# Patient Record
Sex: Female | Born: 1945 | Race: White | Hispanic: No | Marital: Single | Smoking: Never smoker
Health system: Southern US, Community
[De-identification: ages and names within clinical notes are randomized; demographics above are authoritative.]

## PROBLEM LIST (undated history)

## (undated) DIAGNOSIS — R413 Other amnesia: Secondary | ICD-10-CM

## (undated) DIAGNOSIS — F32A Depression, unspecified: Secondary | ICD-10-CM

## (undated) DIAGNOSIS — F329 Major depressive disorder, single episode, unspecified: Secondary | ICD-10-CM

## (undated) DIAGNOSIS — E538 Deficiency of other specified B group vitamins: Secondary | ICD-10-CM

## (undated) DIAGNOSIS — G309 Alzheimer's disease, unspecified: Secondary | ICD-10-CM

## (undated) DIAGNOSIS — T4145XA Adverse effect of unspecified anesthetic, initial encounter: Secondary | ICD-10-CM

## (undated) DIAGNOSIS — I5041 Acute combined systolic (congestive) and diastolic (congestive) heart failure: Secondary | ICD-10-CM

## (undated) DIAGNOSIS — F0280 Dementia in other diseases classified elsewhere without behavioral disturbance: Secondary | ICD-10-CM

## (undated) DIAGNOSIS — T8859XA Other complications of anesthesia, initial encounter: Secondary | ICD-10-CM

## (undated) DIAGNOSIS — I10 Essential (primary) hypertension: Secondary | ICD-10-CM

## (undated) DIAGNOSIS — I219 Acute myocardial infarction, unspecified: Secondary | ICD-10-CM

## (undated) DIAGNOSIS — I513 Intracardiac thrombosis, not elsewhere classified: Secondary | ICD-10-CM

## (undated) HISTORY — DX: Other amnesia: R41.3

## (undated) HISTORY — DX: Dementia in other diseases classified elsewhere without behavioral disturbance: F02.80

## (undated) HISTORY — PX: APPENDECTOMY: SHX54

## (undated) HISTORY — DX: Deficiency of other specified B group vitamins: E53.8

## (undated) HISTORY — DX: Alzheimer's disease, unspecified: G30.9

---
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
## 1997-12-04 ENCOUNTER — Ambulatory Visit (HOSPITAL_COMMUNITY): Admission: RE | Admit: 1997-12-04 | Discharge: 1997-12-04 | Payer: Self-pay | Admitting: Obstetrics & Gynecology

## 1997-12-21 ENCOUNTER — Other Ambulatory Visit: Admission: RE | Admit: 1997-12-21 | Discharge: 1997-12-21 | Payer: Self-pay | Admitting: Obstetrics & Gynecology

## 1999-11-28 ENCOUNTER — Ambulatory Visit (HOSPITAL_COMMUNITY): Admission: RE | Admit: 1999-11-28 | Discharge: 1999-11-28 | Payer: Self-pay | Admitting: Obstetrics & Gynecology

## 1999-11-28 ENCOUNTER — Encounter: Payer: Self-pay | Admitting: Obstetrics & Gynecology

## 1999-12-26 ENCOUNTER — Other Ambulatory Visit: Admission: RE | Admit: 1999-12-26 | Discharge: 1999-12-26 | Payer: Self-pay | Admitting: Family Medicine

## 2005-06-20 ENCOUNTER — Ambulatory Visit (HOSPITAL_COMMUNITY): Admission: RE | Admit: 2005-06-20 | Discharge: 2005-06-20 | Payer: Self-pay | Admitting: Family Medicine

## 2005-06-23 ENCOUNTER — Ambulatory Visit (HOSPITAL_COMMUNITY): Admission: RE | Admit: 2005-06-23 | Discharge: 2005-06-23 | Payer: Self-pay | Admitting: Interventional Radiology

## 2005-07-10 ENCOUNTER — Encounter: Payer: Self-pay | Admitting: Interventional Radiology

## 2010-06-19 ENCOUNTER — Encounter: Payer: Self-pay | Admitting: Obstetrics & Gynecology

## 2013-06-11 ENCOUNTER — Emergency Department (HOSPITAL_BASED_OUTPATIENT_CLINIC_OR_DEPARTMENT_OTHER)
Admission: EM | Admit: 2013-06-11 | Discharge: 2013-06-11 | Disposition: A | Discharge status: Home / Self Care | Payer: Medicare Other | Attending: Emergency Medicine | Admitting: Emergency Medicine

## 2013-06-11 ENCOUNTER — Emergency Department (HOSPITAL_BASED_OUTPATIENT_CLINIC_OR_DEPARTMENT_OTHER): Payer: Medicare Other

## 2013-06-11 ENCOUNTER — Encounter (HOSPITAL_BASED_OUTPATIENT_CLINIC_OR_DEPARTMENT_OTHER): Payer: Self-pay | Admitting: Emergency Medicine

## 2013-06-11 DIAGNOSIS — I10 Essential (primary) hypertension: Secondary | ICD-10-CM | POA: Insufficient documentation

## 2013-06-11 DIAGNOSIS — Y929 Unspecified place or not applicable: Secondary | ICD-10-CM | POA: Insufficient documentation

## 2013-06-11 DIAGNOSIS — Y939 Activity, unspecified: Secondary | ICD-10-CM | POA: Insufficient documentation

## 2013-06-11 DIAGNOSIS — S42302A Unspecified fracture of shaft of humerus, left arm, initial encounter for closed fracture: Secondary | ICD-10-CM

## 2013-06-11 DIAGNOSIS — S42213A Unspecified displaced fracture of surgical neck of unspecified humerus, initial encounter for closed fracture: Secondary | ICD-10-CM | POA: Insufficient documentation

## 2013-06-11 DIAGNOSIS — Z79899 Other long term (current) drug therapy: Secondary | ICD-10-CM | POA: Insufficient documentation

## 2013-06-11 DIAGNOSIS — R296 Repeated falls: Secondary | ICD-10-CM | POA: Insufficient documentation

## 2013-06-11 HISTORY — DX: Essential (primary) hypertension: I10

## 2013-06-11 MED ORDER — HYDROCODONE-ACETAMINOPHEN 5-325 MG PO TABS
2.0000 | ORAL_TABLET | Freq: Once | ORAL | Status: AC
  Administered 2013-06-11: 2 via ORAL
  Filled 2013-06-11: qty 2

## 2013-06-11 MED ORDER — HYDROCODONE-ACETAMINOPHEN 5-325 MG PO TABS
1.0000 | ORAL_TABLET | Freq: Four times a day (QID) | ORAL | Status: DC | PRN
Start: 2013-06-11 — End: 2013-11-27

## 2013-06-11 NOTE — ED Notes (Signed)
Larey Seat this morning on cement stairs and tried to catch herself with her left arm. States the pain starts in her right shoulder and radiates down. Good pulse and color

## 2013-06-11 NOTE — Discharge Instructions (Signed)
Humerus Fracture, Treated with Immobilization °The humerus is the large bone in your upper arm. You have a broken (fractured) humerus. These fractures are easily diagnosed with X-rays. °TREATMENT  °Simple fractures which will heal without disability are treated with simple immobilization. Immobilization means you will wear a cast, splint, or sling. You have a fracture which will do well with immobilization. The fracture will heal well simply by being held in a good position until it is stable enough to begin range of motion exercises. Do not take part in activities which would further injure your arm.  °HOME CARE INSTRUCTIONS  °· Put ice on the injured area. °· Put ice in a plastic bag. °· Place a towel between your skin and the bag. °· Leave the ice on for 15-20 minutes, 03-04 times a day. °· If you have a cast: °· Do not scratch the skin under the cast using sharp or pointed objects. °· Check the skin around the cast every day. You may put lotion on any red or sore areas. °· Keep your cast dry and clean. °· If you have a splint: °· Wear the splint as directed. °· Keep your splint dry and clean. °· You may loosen the elastic around the splint if your fingers become numb, tingle, or turn cold or blue. °· If you have a sling: °· Wear the sling as directed. °· Do not put pressure on any part of your cast or splint until it is fully hardened. °· Your cast or splint can be protected during bathing with a plastic bag. Do not lower the cast or splint into water. °· Only take over-the-counter or prescription medicines for pain, discomfort, or fever as directed by your caregiver. °· Do range of motion exercises as instructed by your caregiver. °· Follow up as directed by your caregiver. This is very important in order to avoid permanent injury or disability and chronic pain. °SEEK IMMEDIATE MEDICAL CARE IF:  °· Your skin or nails in the injured arm turn blue or gray. °· Your arm feels cold or numb. °· You develop severe  pain in the injured arm. °· You are having problems with the medicines you were given. °MAKE SURE YOU:  °· Understand these instructions. °· Will watch your condition. °· Will get help right away if you are not doing well or get worse. °Document Released: 08/21/2000 Document Revised: 08/07/2011 Document Reviewed: 06/29/2010 °[employer]® Patient Information ©2014 [employer]. ° °

## 2013-06-11 NOTE — ED Provider Notes (Signed)
CSN: [id number]     Arrival date & time 06/11/13  1322 History   First MD Initiated Contact with Patient 06/11/13 1359     Chief Complaint  Patient presents with  . Arm Injury   (Consider location/radiation/quality/duration/timing/severity/associated sxs/prior Treatment) HPI Comments: Slipped on ice, fell on L arm. No head injury, no LOC.  Patient is a 68 y.o. female presenting with fall. The history is provided by the patient.  Fall This is a new problem. The current episode started 1 to 2 hours ago. Episode frequency: once. The problem has not changed since onset.Pertinent negatives include no chest pain, no abdominal pain and no shortness of breath. Nothing aggravates the symptoms. Nothing relieves the symptoms.    Past Medical History  Diagnosis Date  . Hypertension    Past Surgical History  Procedure Laterality Date  . Back surgery     No family history on file. History  Substance Use Topics  . Smoking status: Never Smoker   . Smokeless tobacco: Not on file  . Alcohol Use: No   OB History   Grav Para Term Preterm Abortions TAB SAB Ect Mult Living                 Review of Systems  Constitutional: Negative for fever and chills.  Respiratory: Negative for shortness of breath.   Cardiovascular: Negative for chest pain.  Gastrointestinal: Negative for abdominal pain.  All other systems reviewed and are negative.    Allergies  Erythromycin  Home Medications   Current Outpatient Rx  Name  Route  Sig  Dispense  Refill  . donepezil (ARICEPT) 10 MG tablet   Oral   Take 10 mg by mouth at bedtime.         . sertraline (ZOLOFT) 100 MG tablet   Oral   Take 100 mg by mouth daily.         . zaleplon (SONATA) 10 MG capsule   Oral   Take 10 mg by mouth at bedtime as needed for sleep.          BP 146/99  Pulse 68  Temp(Src) 98.1 F (36.7 C) (Oral)  Resp 18  Ht 5\' 3"  (1.6 m)  Wt 113 lb (51.256 kg)  BMI 20.02 kg/m2  SpO2 100% Physical Exam  Nursing  note and vitals reviewed. Constitutional: She is oriented to person, place, and time. She appears well-developed and well-nourished. No distress.  HENT:  Head: Normocephalic and atraumatic.  Eyes: EOM are normal. Pupils are equal, round, and reactive to light.  Neck: Normal range of motion. Neck supple.  Cardiovascular: Normal rate and regular rhythm.  Exam reveals no friction rub.   No murmur heard. Pulmonary/Chest: Effort normal and breath sounds normal. No respiratory distress. She has no wheezes. She has no rales.  Abdominal: Soft. She exhibits no distension. There is no tenderness. There is no rebound.  Musculoskeletal: She exhibits no edema.       Left shoulder: She exhibits decreased range of motion, tenderness, bony tenderness and swelling (mild, lateral deltoid).  Neurological: She is alert and oriented to person, place, and time.  Skin: She is not diaphoretic.    ED Course  Procedures (including critical care time) Labs Review Labs Reviewed - No data to display Imaging Review Dg Forearm Left  06/11/2013   CLINICAL DATA:  Fall, left arm pain  EXAM: LEFT FOREARM - 2 VIEW  COMPARISON:  Concurrently obtained radiographs of the humerus and hand  FINDINGS: There is  no evidence of fracture or other focal bone lesions. Soft tissues are unremarkable.  IMPRESSION: Negative.   Electronically Signed   By: Malachy Moan M.D.   On: 06/11/2013 15:04   Dg Humerus Left  06/11/2013   CLINICAL DATA:  Fall, left arm pain  EXAM: LEFT HUMERUS - 2+ VIEW  COMPARISON:  Concurrently obtained radiographs of a forearm and hand  FINDINGS: Comminuted and slightly impacted fracture through the surgical neck of the humerus extending into the greater trochanter. The humeral head appears located with respect to the glenoid. Normal bony mineralization. No lytic or blastic osseous lesion. Visualized thorax unremarkable.  IMPRESSION: Comminuted fracture through the surgical neck of the humerus extending into the  greater trochanter.   Electronically Signed   By: Malachy Moan M.D.   On: 06/11/2013 15:04   Dg Hand Complete Left  06/11/2013   CLINICAL DATA:  Fall, left arm pain  EXAM: LEFT HAND - COMPLETE 3+ VIEW  COMPARISON:  Concurrently obtained radiographs of the humerus and forearm  FINDINGS: There is no evidence of fracture or dislocation. The bones are mildly osteopenic. 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 Irregularity to the proximal aspect of the distal phalanx of the small finger appears well corticated and is may reflect the sequelae of a remote healed fracture. Osteoarthritis at the thumb carpometacarpal joint. .  IMPRESSION: Favor residual posttraumatic deformity from a remote healed fracture through the distal phalanx of the little finger. Given the background osteopenia, an acute fracture is a less likely possibility. Recommend clinical correlation for point tenderness at this site.  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   Electronically Signed   By: Malachy Moan M.D.   On: 06/11/2013 15:07    EKG Interpretation   None       MDM   1. Fracture of left humerus    47F fell onto stairs today. Landed on outstretched L elbow/forearm. No head injury, did not continue to fall down the stairs. Has L superior humerus pain, L forearm pain. Diffuse pain throughout L arm, patient unable to delineate source of pain. Will xray. NVI distally.  Xrays show L humerus fracture at surgical neck. Placed in sling. Given pain meds, Ortho f/u.    Dagmar Hait, MD 06/11/13 [phone]

## 2013-11-27 ENCOUNTER — Encounter (HOSPITAL_COMMUNITY)
Admission: EM | Disposition: A | Discharge status: Home / Self Care | Payer: Self-pay | Source: Home / Self Care | Attending: Emergency Medicine

## 2013-11-27 ENCOUNTER — Encounter (HOSPITAL_COMMUNITY)
Admission: EM | Disposition: A | Discharge status: Home / Self Care | Payer: Self-pay | Source: Home / Self Care | Attending: Orthopedic Surgery

## 2013-11-27 ENCOUNTER — Emergency Department (HOSPITAL_COMMUNITY): Payer: Medicare Other

## 2013-11-27 ENCOUNTER — Inpatient Hospital Stay: Admit: 2013-11-27 | Payer: Self-pay | Admitting: Orthopedic Surgery

## 2013-11-27 ENCOUNTER — Emergency Department (HOSPITAL_COMMUNITY)
Admission: EM | Admit: 2013-11-27 | Discharge: 2013-11-27 | Disposition: A | Discharge status: Home / Self Care | Payer: Medicare Other | Source: Home / Self Care | Attending: Emergency Medicine | Admitting: Emergency Medicine

## 2013-11-27 ENCOUNTER — Inpatient Hospital Stay (HOSPITAL_COMMUNITY): Payer: Medicare Other | Admitting: Certified Registered Nurse Anesthetist

## 2013-11-27 ENCOUNTER — Observation Stay (HOSPITAL_COMMUNITY)
Admission: EM | Admit: 2013-11-27 | Discharge: 2013-11-28 | Disposition: A | Discharge status: Home / Self Care | Payer: Medicare Other | Attending: Emergency Medicine | Admitting: Emergency Medicine

## 2013-11-27 ENCOUNTER — Encounter (HOSPITAL_COMMUNITY): Payer: Medicare Other | Admitting: Certified Registered Nurse Anesthetist

## 2013-11-27 ENCOUNTER — Encounter (HOSPITAL_COMMUNITY): Payer: Self-pay | Admitting: Emergency Medicine

## 2013-11-27 ENCOUNTER — Encounter (HOSPITAL_COMMUNITY): Payer: Self-pay | Admitting: *Deleted

## 2013-11-27 DIAGNOSIS — Z881 Allergy status to other antibiotic agents status: Secondary | ICD-10-CM

## 2013-11-27 DIAGNOSIS — Y92838 Other recreation area as the place of occurrence of the external cause: Secondary | ICD-10-CM | POA: Diagnosis not present

## 2013-11-27 DIAGNOSIS — S0083XA Contusion of other part of head, initial encounter: Secondary | ICD-10-CM | POA: Insufficient documentation

## 2013-11-27 DIAGNOSIS — Z79899 Other long term (current) drug therapy: Secondary | ICD-10-CM | POA: Diagnosis not present

## 2013-11-27 DIAGNOSIS — Y9239 Other specified sports and athletic area as the place of occurrence of the external cause: Secondary | ICD-10-CM | POA: Diagnosis not present

## 2013-11-27 DIAGNOSIS — S1093XA Contusion of unspecified part of neck, initial encounter: Secondary | ICD-10-CM | POA: Insufficient documentation

## 2013-11-27 DIAGNOSIS — W010XXA Fall on same level from slipping, tripping and stumbling without subsequent striking against object, initial encounter: Secondary | ICD-10-CM | POA: Insufficient documentation

## 2013-11-27 DIAGNOSIS — F329 Major depressive disorder, single episode, unspecified: Secondary | ICD-10-CM | POA: Diagnosis not present

## 2013-11-27 DIAGNOSIS — F3289 Other specified depressive episodes: Secondary | ICD-10-CM | POA: Insufficient documentation

## 2013-11-27 DIAGNOSIS — S0003XA Contusion of scalp, initial encounter: Secondary | ICD-10-CM

## 2013-11-27 DIAGNOSIS — S52279A Monteggia's fracture of unspecified ulna, initial encounter for closed fracture: Secondary | ICD-10-CM

## 2013-11-27 DIAGNOSIS — S0033XA Contusion of nose, initial encounter: Secondary | ICD-10-CM

## 2013-11-27 DIAGNOSIS — I10 Essential (primary) hypertension: Secondary | ICD-10-CM

## 2013-11-27 DIAGNOSIS — S52101A Unspecified fracture of upper end of right radius, initial encounter for closed fracture: Secondary | ICD-10-CM

## 2013-11-27 DIAGNOSIS — S52121A Displaced fracture of head of right radius, initial encounter for closed fracture: Secondary | ICD-10-CM

## 2013-11-27 DIAGNOSIS — S52001A Unspecified fracture of upper end of right ulna, initial encounter for closed fracture: Secondary | ICD-10-CM

## 2013-11-27 DIAGNOSIS — S59909A Unspecified injury of unspecified elbow, initial encounter: Secondary | ICD-10-CM | POA: Diagnosis present

## 2013-11-27 HISTORY — DX: Major depressive disorder, single episode, unspecified: F32.9

## 2013-11-27 HISTORY — DX: Other complications of anesthesia, initial encounter: T88.59XA

## 2013-11-27 HISTORY — DX: Adverse effect of unspecified anesthetic, initial encounter: T41.45XA

## 2013-11-27 HISTORY — DX: Depression, unspecified: F32.A

## 2013-11-27 HISTORY — PX: ORIF ELBOW FRACTURE: SHX5031

## 2013-11-27 LAB — BASIC METABOLIC PANEL
Anion gap: 13 (ref 5–15)
BUN: 18 mg/dL (ref 6–23)
CHLORIDE: 104 meq/L (ref 96–112)
CO2: 24 meq/L (ref 19–32)
Calcium: 9.5 mg/dL (ref 8.4–10.5)
Creatinine, Ser: 0.79 mg/dL (ref 0.50–1.10)
GFR calc Af Amer: >90 mL/min (ref >90)
GFR calc non Af Amer: 85 mL/min — ABNORMAL LOW (ref >90)
GLUCOSE: 155 mg/dL — AB (ref 70–99)
POTASSIUM: 4 meq/L (ref 3.7–5.3)
SODIUM: 141 meq/L (ref 137–147)

## 2013-11-27 LAB — CBC
HCT: 40.9 % (ref 36.0–46.0)
HEMOGLOBIN: 13.5 g/dL (ref 12.0–15.0)
MCH: 31.1 pg (ref 26.0–34.0)
MCHC: 33 g/dL (ref 30.0–36.0)
MCV: 94.2 fL (ref 78.0–100.0)
PLATELETS: 250 10*3/uL (ref 150–400)
RBC: 4.34 MIL/uL (ref 3.87–5.11)
RDW: 14.2 % (ref 11.5–15.5)
WBC: 9.5 10*3/uL (ref 4.0–10.5)

## 2013-11-27 LAB — PROTIME-INR
INR: 1.05 (ref 0.00–1.49)
Prothrombin Time: 13.7 seconds (ref 11.6–15.2)

## 2013-11-27 SURGERY — OPEN REDUCTION INTERNAL FIXATION (ORIF) ELBOW/OLECRANON FRACTURE
Anesthesia: General | Laterality: Right

## 2013-11-27 SURGERY — OPEN REDUCTION INTERNAL FIXATION (ORIF) ELBOW/OLECRANON FRACTURE
Anesthesia: General | Site: Arm Lower | Laterality: Right

## 2013-11-27 MED ORDER — ARTIFICIAL TEARS OP OINT
TOPICAL_OINTMENT | OPHTHALMIC | Status: DC | PRN
  Administered 2013-11-27: 1 via OPHTHALMIC

## 2013-11-27 MED ORDER — SUCCINYLCHOLINE CHLORIDE 20 MG/ML IJ SOLN
INTRAMUSCULAR | Status: DC | PRN
  Administered 2013-11-27: 100 mg via INTRAVENOUS

## 2013-11-27 MED ORDER — LACTATED RINGERS IV SOLN
INTRAVENOUS | Status: DC | PRN
  Administered 2013-11-27 (×2): via INTRAVENOUS

## 2013-11-27 MED ORDER — ADULT MULTIVITAMIN W/MINERALS CH: 1.0000 | ORAL_TABLET | Freq: Every day | ORAL | Status: DC

## 2013-11-27 MED ORDER — SERTRALINE HCL 100 MG PO TABS
100.0000 mg | ORAL_TABLET | Freq: Every day | ORAL | Status: DC
  Administered 2013-11-28: 100 mg via ORAL
  Filled 2013-11-27: qty 1

## 2013-11-27 MED ORDER — MIRTAZAPINE 15 MG PO TABS
15.0000 mg | ORAL_TABLET | Freq: Every day | ORAL | Status: DC
  Administered 2013-11-27: 15 mg via ORAL
  Filled 2013-11-27 (×2): qty 1

## 2013-11-27 MED ORDER — CHLORHEXIDINE GLUCONATE 4 % EX LIQD
60.0000 mL | Freq: Once | CUTANEOUS | Status: DC
  Filled 2013-11-27: qty 60

## 2013-11-27 MED ORDER — CEFAZOLIN SODIUM-DEXTROSE 2-3 GM-% IV SOLR
2.0000 g | INTRAVENOUS | Status: AC
  Administered 2013-11-27 – 2013-11-28 (×2): 2 g via INTRAVENOUS

## 2013-11-27 MED ORDER — ONDANSETRON HCL 4 MG/2ML IJ SOLN
4.0000 mg | Freq: Once | INTRAMUSCULAR | Status: DC
  Filled 2013-11-27: qty 2

## 2013-11-27 MED ORDER — ONDANSETRON HCL 4 MG/2ML IJ SOLN: 4.0000 mg | Freq: Once | INTRAMUSCULAR | Status: DC | PRN

## 2013-11-27 MED ORDER — HYDROCODONE-ACETAMINOPHEN 7.5-325 MG PO TABS: 1.0000 | ORAL_TABLET | ORAL | Status: DC | PRN

## 2013-11-27 MED ORDER — OXYCODONE HCL 5 MG PO TABS: 5.0000 mg | ORAL_TABLET | Freq: Once | ORAL | Status: DC | PRN

## 2013-11-27 MED ORDER — OXYCODONE HCL 5 MG/5ML PO SOLN: 5.0000 mg | Freq: Once | ORAL | Status: DC | PRN

## 2013-11-27 MED ORDER — ALPRAZOLAM 0.5 MG PO TABS: 0.5000 mg | ORAL_TABLET | Freq: Four times a day (QID) | ORAL | Status: DC | PRN

## 2013-11-27 MED ORDER — 0.9 % SODIUM CHLORIDE (POUR BTL) OPTIME
TOPICAL | Status: DC | PRN
  Administered 2013-11-27: 1000 mL

## 2013-11-27 MED ORDER — DOCUSATE SODIUM 100 MG PO CAPS
100.0000 mg | ORAL_CAPSULE | Freq: Two times a day (BID) | ORAL | Status: DC
  Administered 2013-11-27: 100 mg via ORAL
  Filled 2013-11-27 (×2): qty 1

## 2013-11-27 MED ORDER — HYDROMORPHONE HCL PF 1 MG/ML IJ SOLN: 0.2500 mg | INTRAMUSCULAR | Status: DC | PRN

## 2013-11-27 MED ORDER — VITAMIN C 500 MG PO TABS
1000.0000 mg | ORAL_TABLET | Freq: Every day | ORAL | Status: DC
  Administered 2013-11-28: 1000 mg via ORAL
  Filled 2013-11-27: qty 2

## 2013-11-27 MED ORDER — PROPOFOL 10 MG/ML IV BOLUS
INTRAVENOUS | Status: DC | PRN
  Administered 2013-11-27: 150 mg via INTRAVENOUS

## 2013-11-27 MED ORDER — QUINAPRIL HCL 10 MG PO TABS
40.0000 mg | ORAL_TABLET | Freq: Every day | ORAL | Status: DC
  Administered 2013-11-28: 40 mg via ORAL
  Filled 2013-11-27: qty 4

## 2013-11-27 MED ORDER — LACTATED RINGERS IV SOLN
INTRAVENOUS | Status: DC
  Administered 2013-11-27: 17:00:00 via INTRAVENOUS

## 2013-11-27 MED ORDER — EPHEDRINE SULFATE 50 MG/ML IJ SOLN
INTRAMUSCULAR | Status: DC | PRN
  Administered 2013-11-27: 10 mg via INTRAVENOUS

## 2013-11-27 MED ORDER — MIDAZOLAM HCL 5 MG/5ML IJ SOLN
INTRAMUSCULAR | Status: DC | PRN
  Administered 2013-11-27 (×2): 1 mg via INTRAVENOUS

## 2013-11-27 MED ORDER — KCL IN DEXTROSE-NACL 20-5-0.45 MEQ/L-%-% IV SOLN
INTRAVENOUS | Status: DC
  Administered 2013-11-27: via INTRAVENOUS
  Filled 2013-11-27 (×2): qty 1000

## 2013-11-27 MED ORDER — HYDROMORPHONE HCL PF 1 MG/ML IJ SOLN
0.5000 mg | Freq: Once | INTRAMUSCULAR | Status: AC
  Administered 2013-11-27: 0.5 mg via INTRAVENOUS
  Filled 2013-11-27: qty 1

## 2013-11-27 MED ORDER — ADULT MULTIVITAMIN W/MINERALS CH
1.0000 | ORAL_TABLET | Freq: Every day | ORAL | Status: DC
  Filled 2013-11-27 (×2): qty 1

## 2013-11-27 MED ORDER — CEFAZOLIN SODIUM-DEXTROSE 2-3 GM-% IV SOLR
INTRAVENOUS | Status: AC
  Filled 2013-11-27: qty 50

## 2013-11-27 MED ORDER — ONDANSETRON HCL 4 MG PO TABS: 4.0000 mg | ORAL_TABLET | Freq: Four times a day (QID) | ORAL | Status: DC | PRN

## 2013-11-27 MED ORDER — LIDOCAINE HCL (CARDIAC) 20 MG/ML IV SOLN
INTRAVENOUS | Status: DC | PRN
  Administered 2013-11-27: 60 mg via INTRAVENOUS

## 2013-11-27 MED ORDER — DIPHENHYDRAMINE HCL 25 MG PO CAPS: 25.0000 mg | ORAL_CAPSULE | Freq: Four times a day (QID) | ORAL | Status: DC | PRN

## 2013-11-27 MED ORDER — CEFAZOLIN SODIUM 1-5 GM-% IV SOLN
1.0000 g | Freq: Three times a day (TID) | INTRAVENOUS | Status: DC
  Administered 2013-11-28: 1 g via INTRAVENOUS
  Filled 2013-11-27 (×4): qty 50

## 2013-11-27 MED ORDER — METHOCARBAMOL 500 MG PO TABS: 500.0000 mg | ORAL_TABLET | Freq: Four times a day (QID) | ORAL | Status: DC | PRN

## 2013-11-27 MED ORDER — DONEPEZIL HCL 10 MG PO TABS
10.0000 mg | ORAL_TABLET | Freq: Every day | ORAL | Status: DC
  Administered 2013-11-27: 10 mg via ORAL
  Filled 2013-11-27 (×2): qty 1

## 2013-11-27 MED ORDER — ONDANSETRON HCL 4 MG/2ML IJ SOLN: 4.0000 mg | Freq: Four times a day (QID) | INTRAMUSCULAR | Status: DC | PRN

## 2013-11-27 MED ORDER — OXYCODONE-ACETAMINOPHEN 5-325 MG PO TABS
1.0000 | ORAL_TABLET | ORAL | Status: DC | PRN
  Administered 2013-11-28 (×2): 2 via ORAL
  Filled 2013-11-27 (×2): qty 2

## 2013-11-27 MED ORDER — DEXAMETHASONE SODIUM PHOSPHATE 4 MG/ML IJ SOLN
INTRAMUSCULAR | Status: DC | PRN
  Administered 2013-11-27: 4 mg via INTRAVENOUS

## 2013-11-27 MED ORDER — ONDANSETRON HCL 4 MG/2ML IJ SOLN
INTRAMUSCULAR | Status: DC | PRN
  Administered 2013-11-27: 4 mg via INTRAVENOUS

## 2013-11-27 MED ORDER — CEFAZOLIN SODIUM 1-5 GM-% IV SOLN: 1.0000 g | INTRAVENOUS | Status: DC

## 2013-11-27 MED ORDER — MORPHINE SULFATE 2 MG/ML IJ SOLN: 1.0000 mg | INTRAMUSCULAR | Status: DC | PRN

## 2013-11-27 MED ORDER — FENTANYL CITRATE 0.05 MG/ML IJ SOLN
INTRAMUSCULAR | Status: DC | PRN
  Administered 2013-11-27 (×2): 50 ug via INTRAVENOUS

## 2013-11-27 MED ORDER — METHOCARBAMOL 1000 MG/10ML IJ SOLN: 500.0000 mg | Freq: Four times a day (QID) | INTRAVENOUS | Status: DC | PRN

## 2013-11-27 MED ORDER — BUPIVACAINE-EPINEPHRINE (PF) 0.5% -1:200000 IJ SOLN
INTRAMUSCULAR | Status: DC | PRN
  Administered 2013-11-27: 22 mL via PERINEURAL

## 2013-11-27 SURGICAL SUPPLY — 80 items
BANDAGE ELASTIC 3 VELCRO ST LF (GAUZE/BANDAGES/DRESSINGS) ×2 IMPLANT
BANDAGE ELASTIC 4 VELCRO ST LF (GAUZE/BANDAGES/DRESSINGS) ×2 IMPLANT
BANDAGE GAUZE 4  KLING STR (GAUZE/BANDAGES/DRESSINGS) ×2 IMPLANT
BANDAGE GAUZE ELAST BULKY 4 IN (GAUZE/BANDAGES/DRESSINGS) IMPLANT
BIT DRILL 2.5X2.75 QC CALB (BIT) ×2 IMPLANT
BIT DRILL CALIBRATED 2.7 (BIT) ×1 IMPLANT
BIT DRILL CALIBRATED 2.7MM (BIT) ×1
BLADE AVERAGE 25MMX9MM (BLADE) ×1
BLADE AVERAGE 25X9 (BLADE) ×1 IMPLANT
BNDG CMPR 9X4 STRL LF SNTH (GAUZE/BANDAGES/DRESSINGS) ×1
BNDG COHESIVE 4X5 TAN STRL (GAUZE/BANDAGES/DRESSINGS) ×3 IMPLANT
BNDG ESMARK 4X9 LF (GAUZE/BANDAGES/DRESSINGS) ×3 IMPLANT
BNDG GAUZE ELAST 4 BULKY (GAUZE/BANDAGES/DRESSINGS) ×2 IMPLANT
CORDS BIPOLAR (ELECTRODE) ×3 IMPLANT
COVER MAYO STAND STRL (DRAPES) ×3 IMPLANT
COVER SURGICAL LIGHT HANDLE (MISCELLANEOUS) ×3 IMPLANT
CUFF TOURNIQUET SINGLE 18IN (TOURNIQUET CUFF) ×3 IMPLANT
CUFF TOURNIQUET SINGLE 24IN (TOURNIQUET CUFF) IMPLANT
DRAPE INCISE IOBAN 66X45 STRL (DRAPES) ×3 IMPLANT
DRAPE OEC MINIVIEW 54X84 (DRAPES) IMPLANT
DRAPE ORTHO SPLIT 77X108 STRL (DRAPES) ×6
DRAPE SURG ORHT 6 SPLT 77X108 (DRAPES) ×2 IMPLANT
DRAPE U-SHAPE 47X51 STRL (DRAPES) ×3 IMPLANT
DRSG ADAPTIC 3X8 NADH LF (GAUZE/BANDAGES/DRESSINGS) IMPLANT
GAUZE XEROFORM 5X9 LF (GAUZE/BANDAGES/DRESSINGS) ×2 IMPLANT
GLOVE BIOGEL PI IND STRL 6.5 (GLOVE) IMPLANT
GLOVE BIOGEL PI IND STRL 8.5 (GLOVE) ×1 IMPLANT
GLOVE BIOGEL PI INDICATOR 6.5 (GLOVE) ×2
GLOVE BIOGEL PI INDICATOR 8.5 (GLOVE) ×2
GLOVE ECLIPSE 6.5 STRL STRAW (GLOVE) ×2 IMPLANT
GLOVE SURG ORTHO 8.0 STRL STRW (GLOVE) ×3 IMPLANT
GOWN STRL REUS W/ TWL LRG LVL3 (GOWN DISPOSABLE) ×2 IMPLANT
GOWN STRL REUS W/ TWL XL LVL3 (GOWN DISPOSABLE) ×1 IMPLANT
GOWN STRL REUS W/TWL LRG LVL3 (GOWN DISPOSABLE) ×6
GOWN STRL REUS W/TWL XL LVL3 (GOWN DISPOSABLE) ×3
HEAD EXPLOR 10X24MM (Head) ×2 IMPLANT
K-WIRE FIXATION 2.0X6 (WIRE) ×6
KIT BASIN OR (CUSTOM PROCEDURE TRAY) ×3 IMPLANT
KIT ROOM TURNOVER OR (KITS) ×3 IMPLANT
KWIRE FIXATION 2.0X6 (WIRE) IMPLANT
LOOP VESSEL MAXI BLUE (MISCELLANEOUS) IMPLANT
MANIFOLD NEPTUNE II (INSTRUMENTS) ×3 IMPLANT
NDL HYPO 25GX1X1/2 BEV (NEEDLE) IMPLANT
NEEDLE HYPO 25GX1X1/2 BEV (NEEDLE) IMPLANT
NS IRRIG 1000ML POUR BTL (IV SOLUTION) ×3 IMPLANT
PACK ORTHO EXTREMITY (CUSTOM PROCEDURE TRAY) ×3 IMPLANT
PAD ARMBOARD 7.5X6 YLW CONV (MISCELLANEOUS) ×6 IMPLANT
PAD CAST 4YDX4 CTTN HI CHSV (CAST SUPPLIES) IMPLANT
PADDING CAST COTTON 4X4 STRL (CAST SUPPLIES) ×3
PLATE OLECRANON LRG (Plate) ×2 IMPLANT
PUTTY DBM STAGRAFT PLUS 5CC (Putty) ×2 IMPLANT
SCREW CORT T15 24X3.5XST LCK (Screw) IMPLANT
SCREW CORTICAL 3.5X24MM (Screw) ×3 IMPLANT
SCREW CORTICAL LOW PROF 3.5X20 (Screw) ×2 IMPLANT
SCREW LOCK CORT STAR 3.5X12 (Screw) ×4 IMPLANT
SCREW LOCK CORT STAR 3.5X16 (Screw) ×6 IMPLANT
SCREW LOW PROFILE 22MMX3.5MM (Screw) ×2 IMPLANT
SCREW LP 3.5 (Screw) ×2 IMPLANT
SCREW NON LOCKING LP 3.5 16MM (Screw) ×4 IMPLANT
SOAP 2 % CHG 4 OZ (WOUND CARE) ×3 IMPLANT
SPLINT FIBERGLASS 3X35 (CAST SUPPLIES) ×2 IMPLANT
SPONGE GAUZE 4X4 12PLY (GAUZE/BANDAGES/DRESSINGS) ×2 IMPLANT
STAPLER VISISTAT (STAPLE) ×2 IMPLANT
STEM IMPLANT W SCREW (Stem) ×2 IMPLANT
SUCTION FRAZIER TIP 10 FR DISP (SUCTIONS) IMPLANT
SUT MERSILENE 4 0 P 3 (SUTURE) IMPLANT
SUT PROLENE 4 0 PS 2 18 (SUTURE) IMPLANT
SUT VIC AB 0 CT1 27 (SUTURE) ×6
SUT VIC AB 0 CT1 27XBRD ANBCTR (SUTURE) IMPLANT
SUT VIC AB 2-0 CT1 27 (SUTURE) ×3
SUT VIC AB 2-0 CT1 TAPERPNT 27 (SUTURE) IMPLANT
SUT VICRYL 4-0 PS2 18IN ABS (SUTURE) ×2 IMPLANT
SYR CONTROL 10ML LL (SYRINGE) IMPLANT
TOWEL OR 17X24 6PK STRL BLUE (TOWEL DISPOSABLE) ×3 IMPLANT
TOWEL OR 17X26 10 PK STRL BLUE (TOWEL DISPOSABLE) ×6 IMPLANT
TUBE CONNECTING 12'X1/4 (SUCTIONS)
TUBE CONNECTING 12X1/4 (SUCTIONS) IMPLANT
UNDERPAD 30X30 INCONTINENT (UNDERPADS AND DIAPERS) ×3 IMPLANT
WASHER 3.5MM (Orthopedic Implant) ×4 IMPLANT
WATER STERILE IRR 1000ML POUR (IV SOLUTION) ×3 IMPLANT

## 2013-11-27 SURGICAL SUPPLY — 49 items
BANDAGE ELASTIC 3 VELCRO ST LF (GAUZE/BANDAGES/DRESSINGS) IMPLANT
BANDAGE ELASTIC 4 VELCRO ST LF (GAUZE/BANDAGES/DRESSINGS) IMPLANT
BANDAGE GAUZE ELAST BULKY 4 IN (GAUZE/BANDAGES/DRESSINGS) IMPLANT
BNDG CMPR 9X4 STRL LF SNTH (GAUZE/BANDAGES/DRESSINGS) ×1
BNDG COHESIVE 4X5 TAN STRL (GAUZE/BANDAGES/DRESSINGS) ×3 IMPLANT
BNDG ESMARK 4X9 LF (GAUZE/BANDAGES/DRESSINGS) ×3 IMPLANT
CORDS BIPOLAR (ELECTRODE) ×3 IMPLANT
COVER MAYO STAND STRL (DRAPES) ×3 IMPLANT
COVER SURGICAL LIGHT HANDLE (MISCELLANEOUS) ×3 IMPLANT
CUFF TOURNIQUET SINGLE 18IN (TOURNIQUET CUFF) ×3 IMPLANT
CUFF TOURNIQUET SINGLE 24IN (TOURNIQUET CUFF) IMPLANT
DRAPE INCISE IOBAN 66X45 STRL (DRAPES) ×3 IMPLANT
DRAPE OEC MINIVIEW 54X84 (DRAPES) IMPLANT
DRAPE ORTHO SPLIT 77X108 STRL (DRAPES) ×6
DRAPE SURG ORHT 6 SPLT 77X108 (DRAPES) ×2 IMPLANT
DRAPE U-SHAPE 47X51 STRL (DRAPES) ×3 IMPLANT
DRSG ADAPTIC 3X8 NADH LF (GAUZE/BANDAGES/DRESSINGS) IMPLANT
GLOVE BIOGEL PI IND STRL 8.5 (GLOVE) ×1 IMPLANT
GLOVE BIOGEL PI INDICATOR 8.5 (GLOVE) ×2
GLOVE SURG ORTHO 8.0 STRL STRW (GLOVE) ×3 IMPLANT
GOWN STRL REUS W/ TWL LRG LVL3 (GOWN DISPOSABLE) ×2 IMPLANT
GOWN STRL REUS W/ TWL XL LVL3 (GOWN DISPOSABLE) ×1 IMPLANT
GOWN STRL REUS W/TWL LRG LVL3 (GOWN DISPOSABLE) ×6
GOWN STRL REUS W/TWL XL LVL3 (GOWN DISPOSABLE) ×3
KIT BASIN OR (CUSTOM PROCEDURE TRAY) ×3 IMPLANT
KIT ROOM TURNOVER OR (KITS) ×3 IMPLANT
LOOP VESSEL MAXI BLUE (MISCELLANEOUS) IMPLANT
MANIFOLD NEPTUNE II (INSTRUMENTS) ×3 IMPLANT
NDL HYPO 25GX1X1/2 BEV (NEEDLE) IMPLANT
NEEDLE HYPO 25GX1X1/2 BEV (NEEDLE) IMPLANT
NS IRRIG 1000ML POUR BTL (IV SOLUTION) ×3 IMPLANT
PACK ORTHO EXTREMITY (CUSTOM PROCEDURE TRAY) ×3 IMPLANT
PAD ARMBOARD 7.5X6 YLW CONV (MISCELLANEOUS) ×6 IMPLANT
PAD CAST 4YDX4 CTTN HI CHSV (CAST SUPPLIES) IMPLANT
PADDING CAST COTTON 4X4 STRL (CAST SUPPLIES)
SOAP 2 % CHG 4 OZ (WOUND CARE) ×3 IMPLANT
SPONGE GAUZE 4X4 12PLY (GAUZE/BANDAGES/DRESSINGS) IMPLANT
SUCTION FRAZIER TIP 10 FR DISP (SUCTIONS) IMPLANT
SUT MERSILENE 4 0 P 3 (SUTURE) IMPLANT
SUT PROLENE 4 0 PS 2 18 (SUTURE) IMPLANT
SUT VIC AB 2-0 CT1 27 (SUTURE)
SUT VIC AB 2-0 CT1 TAPERPNT 27 (SUTURE) IMPLANT
SYR CONTROL 10ML LL (SYRINGE) IMPLANT
TOWEL OR 17X24 6PK STRL BLUE (TOWEL DISPOSABLE) ×3 IMPLANT
TOWEL OR 17X26 10 PK STRL BLUE (TOWEL DISPOSABLE) ×6 IMPLANT
TUBE CONNECTING 12'X1/4 (SUCTIONS)
TUBE CONNECTING 12X1/4 (SUCTIONS) IMPLANT
UNDERPAD 30X30 INCONTINENT (UNDERPADS AND DIAPERS) ×3 IMPLANT
WATER STERILE IRR 1000ML POUR (IV SOLUTION) ×3 IMPLANT

## 2013-11-27 NOTE — ED Notes (Signed)
Pt had her niece at [facility] [employer] and she took off running after her when she tripped over one of the [employer] things on the floor and fell face forward. Pt states that she tried putting her arms out to catch herself.  Pt did hit her face on the ground. Pt c/o right arm pain and has redden area on her nose. Pt denies taking any blood thinners or LOC.

## 2013-11-27 NOTE — ED Notes (Signed)
Pt declines IV angio/IV pain and nausea meds at this time.  Per Dr. Denton Lank, ok to give dilaudid IM.

## 2013-11-27 NOTE — ED Notes (Signed)
Initial Contact - pt to RM19 with family with c/o trip and fall forward, pt reports hitting head and landing on arms.  Pt c/o 10/10 pain to RUE.  Sling in place at this time.  +csm/+pulses.  Skin PWD.  Pt denies LOC or other complaints.  Speaking full/clear sentences, rr even/un-lab.  A+Ox4.  NAD.

## 2013-11-27 NOTE — ED Provider Notes (Signed)
CSN: [id number]     Arrival date & time 11/27/13  1338 History   None    Chief Complaint  Patient presents with  . Fall  . Arm Injury     (Consider location/radiation/quality/duration/timing/severity/associated sxs/prior Treatment) The history is provided by the patient.  pt s/p fall at local kids indoor playground - pt was running to catch up to grandchild and tripped over object on floor.  Fell forward. Tried to brace fall with outstretched arms. C/o right upper extremity pain, esp in and around elbow.  Constant. Dull. Non radiating. Skin intact. No associated numbness/weakness. Right hand dominant. Contusion to nose. No nosebleeding. No loc. No headache. No neck or back pain. Denies any other pain or injury.      Past Medical History  Diagnosis Date  . Hypertension    Past Surgical History  Procedure Laterality Date  . Back surgery     No family history on file. History  Substance Use Topics  . Smoking status: Never Smoker   . Smokeless tobacco: Not on file  . Alcohol Use: No   OB History   Grav Para Term Preterm Abortions TAB SAB Ect Mult Living                 Review of Systems  Constitutional: Negative for fever.  HENT: Negative for nosebleeds.   Eyes: Negative for redness.  Respiratory: Negative for shortness of breath.   Cardiovascular: Negative for chest pain.  Gastrointestinal: Negative for nausea, vomiting and abdominal pain.  Genitourinary: Negative for flank pain.  Musculoskeletal: Negative for back pain and neck pain.  Skin: Negative for wound.  Neurological: Negative for weakness, numbness and headaches.  Hematological: Does not bruise/bleed easily.  Psychiatric/Behavioral: Negative for confusion.      Allergies  Erythromycin  Home Medications   Prior to Admission medications   Medication Sig Start Date End Date Taking? Authorizing Provider  donepezil (ARICEPT) 10 MG tablet Take 10 mg by mouth at bedtime.    Historical Provider, MD   HYDROcodone-acetaminophen (NORCO/VICODIN) 5-325 MG per tablet Take 1 tablet by mouth every 6 (six) hours as needed for moderate pain. 06/11/13   Dagmar HaitWilliam Blair Walden, MD  sertraline (ZOLOFT) 100 MG tablet Take 100 mg by mouth daily.    Historical Provider, MD  zaleplon (SONATA) 10 MG capsule Take 10 mg by mouth at bedtime as needed for sleep.    Historical Provider, MD   There were no vitals taken for this visit. Physical Exam  Nursing note and vitals reviewed. Constitutional: She is oriented to person, place, and time. She appears well-developed and well-nourished. No distress.  HENT:  Contusion to nose, no gross deformity, no bleeding, no septal hematoma.   Eyes: Conjunctivae are normal. Pupils are equal, round, and reactive to light. No scleral icterus.  Neck: Normal range of motion. Neck supple. No tracheal deviation present.  Cardiovascular: Normal rate and intact distal pulses.   Pulmonary/Chest: Effort normal and breath sounds normal. No respiratory distress. She exhibits no tenderness.  Abdominal: Soft. Normal appearance. She exhibits no distension. There is no tenderness.  Musculoskeletal: She exhibits no edema.  Tenderness left elbow and proximal forearm. Radial pulse 2+.   Good rom bil ext, no other pain or focal bony tenderness noted.   Neurological: She is alert and oriented to person, place, and time.   Left R/M/U nerve fxn, motor and sens intact.  stre 5/5, sens intact.   Skin: Skin is warm and dry. No rash noted.  Psychiatric: She has a normal mood and affect.    ED Course  Procedures (including critical care time)  Dg Elbow Complete Right  11/27/2013   CLINICAL DATA:  Status post fall with pain  EXAM: RIGHT ELBOW - COMPLETE 3+ VIEW  COMPARISON:  None.  FINDINGS: There are comminuted displaced fracture of the proximal radius and ulna.  IMPRESSION: Comminuted displaced fractures of the proximal radius and ulna.   Electronically Signed   By: Sherian Rein M.D.   On:  11/27/2013 15:09   Dg Forearm Right  11/27/2013   CLINICAL DATA:  Fall  EXAM: RIGHT FOREARM - 2 VIEW  COMPARISON:  None.  FINDINGS: There is a comminuted fracture of the proximal ulnar diaphysis with mild displacement. There is a nondisplaced fracture of the proximal radial neck. There is no other fracture. There is no dislocation. Mild relative widening of the scapholunate joint as can be seen with a scapholunate ligament tear. There is soft tissue swelling overlying the proximal dorsal right forearm.  IMPRESSION: 1. Comminuted fracture of the proximal ulnar diaphysis with mild displacement. 2. Nondisplaced fracture of the proximal radial neck.   Electronically Signed   By: Elige Ko   On: 11/27/2013 15:10   Dg Wrist Complete Right  11/27/2013   CLINICAL DATA:  Fall  EXAM: RIGHT WRIST - COMPLETE 3+ VIEW  COMPARISON:  None.  FINDINGS: No fracture or dislocation is seen.  Mild radiocarpal degenerative changes.  Mild degenerative changes at the 1st carpometacarpal joint.  The visualized soft tissues are unremarkable.  IMPRESSION: No fracture or dislocation is seen.   Electronically Signed   By: Charline Bills M.D.   On: 11/27/2013 15:10   Dg Humerus Right  11/27/2013   CLINICAL DATA:  Status post fall with pain  EXAM: RIGHT HUMERUS - 2+ VIEW  COMPARISON:  None.  FINDINGS: There is no evidence of fracture or dislocation of the right humerus. There are comminuted displaced fractures of the proximal ulna and radius.  IMPRESSION: No fracture of the humerus. Fracture of the proximal ulna and radius.   Electronically Signed   By: Sherian Rein M.D.   On: 11/27/2013 15:10      MDM  Iv ns. Dilaudid .5 mg iv. zofran iv.  Xrays.  Reviewed nursing notes and prior charts for additional history.   xrays reviewed w pt.  Ortho hand paged.  Arm splinted.   Recheck pain improved. No numbness/weakness.   Discussed pt with Dr Melvyn Novas who reviewed films - he requests we d/c from ed and send to short stay at  [facility] - they will take to OR from there.  Pt has family here who is agreeable w plan and who will transport her there.   Pt kept npo.  States has not eaten today.    Suzi Roots, MD 11/27/13 [phone]

## 2013-11-27 NOTE — Discharge Instructions (Signed)
KEEP BANDAGE CLEAN AND DRY CALL OFFICE FOR F/U APPT [phone] in 14 days DR [facility] CELL PHONE [phone] KEEP HAND ELEVATED ABOVE HEART OK TO APPLY ICE TO OPERATIVE AREA CONTACT OFFICE IF ANY WORSENING PAIN OR CONCERNS.

## 2013-11-27 NOTE — Brief Op Note (Signed)
11/27/2013  4:59 PM  PATIENT:  Debbie Sims  68 y.o. female  PRE-OPERATIVE DIAGNOSIS:  Right Elbow Fracture  POST-OPERATIVE DIAGNOSIS:  * No post-op diagnosis entered *  PROCEDURE:  Procedure(s): OPEN REDUCTION INTERNAL FIXATION (ORIF) ELBOW/OLECRANON FRACTURE (Right)  SURGEON:  Surgeon(s) and Role:    * Sharma Covert, MD - Primary  PHYSICIAN ASSISTANT:   ASSISTANTS: none   ANESTHESIA:   general  EBL:     BLOOD ADMINISTERED:none  DRAINS: none   LOCAL MEDICATIONS USED:  MARCAINE     SPECIMEN:  No Specimen  DISPOSITION OF SPECIMEN:  N/A  COUNTS:  YES  TOURNIQUET:    DICTATION: .431540  PLAN OF CARE: Admit for overnight observation  PATIENT DISPOSITION:  PACU - hemodynamically stable.   Delay start of Pharmacological VTE agent (>24hrs) due to surgical blood loss or risk of bleeding: not applicable

## 2013-11-27 NOTE — Transfer of Care (Signed)
Immediate Anesthesia Transfer of Care Note  Patient: Debbie Sims  Procedure(s) Performed: Procedure(s): OPEN REDUCTION INTERNAL FIXATION (ORIF) ELBOW/OLECRANON FRACTURE and Radial head replacement (Right)  Patient Location: PACU  Anesthesia Type:GA combined with regional for post-op pain  Level of Consciousness: awake, oriented, patient cooperative and responds to stimulation  Airway & Oxygen Therapy: Patient Spontanous Breathing and Patient connected to nasal cannula oxygen  Post-op Assessment: Report given to PACU RN and Post -op Vital signs reviewed and stable  Post vital signs: Reviewed and stable  Complications: No apparent anesthesia complications

## 2013-11-27 NOTE — Anesthesia Postprocedure Evaluation (Signed)
  Anesthesia Post-op Note  Patient: Debbie Sims  Procedure(s) Performed: Procedure(s): OPEN REDUCTION INTERNAL FIXATION (ORIF) ELBOW/OLECRANON FRACTURE and Radial head replacement (Right)  Patient Location: PACU  Anesthesia Type:GA combined with regional for post-op pain  Level of Consciousness: awake and alert   Airway and Oxygen Therapy: Patient Spontanous Breathing  Post-op Pain: none  Post-op Assessment: Post-op Vital signs reviewed  Post-op Vital Signs: stable  Last Vitals:  Filed Vitals:   11/27/13 2143  BP: 140/86  Pulse: 81  Temp: 36.7 C  Resp: 15    Complications: No apparent anesthesia complications

## 2013-11-27 NOTE — Anesthesia Preprocedure Evaluation (Addendum)
Anesthesia Evaluation  Patient identified by MRN, date of birth, ID band Patient awake    Reviewed: Allergy & Precautions, H&P , NPO status , Patient's Chart, lab work & pertinent test results  History of Anesthesia Complications (+) PROLONGED EMERGENCE  Airway Mallampati: I TM Distance: >3 FB Neck ROM: Full    Dental  (+) Dental Advisory Given, Teeth Intact   Pulmonary neg pulmonary ROS,  breath sounds clear to auscultation  Pulmonary exam normal       Cardiovascular hypertension, Pt. on medications Rhythm:Regular Rate:Normal     Neuro/Psych negative neurological ROS     GI/Hepatic negative GI ROS, Neg liver ROS,   Endo/Other  negative endocrine ROS  Renal/GU negative Renal ROS     Musculoskeletal   Abdominal   Peds  Hematology   Anesthesia Other Findings   Reproductive/Obstetrics                          Anesthesia Physical Anesthesia Plan  ASA: II and emergent  Anesthesia Plan: General   Post-op Pain Management:    Induction: Intravenous  Airway Management Planned: LMA  Additional Equipment:   Intra-op Plan:   Post-operative Plan: Extubation in OR  Informed Consent: I have reviewed the patients History and Physical, chart, labs and discussed the procedure including the risks, benefits and alternatives for the proposed anesthesia with the patient or authorized representative who has indicated his/her understanding and acceptance.   Dental advisory given  Plan Discussed with: CRNA, Anesthesiologist and Surgeon  Anesthesia Plan Comments:         Anesthesia Quick Evaluation

## 2013-11-27 NOTE — Anesthesia Procedure Notes (Addendum)
Anesthesia Regional Block:  Supraclavicular block  Pre-Anesthetic Checklist: ,, timeout performed, Correct Patient, Correct Site, Correct Laterality, Correct Procedure, Correct Position, site marked, Risks and benefits discussed,  Surgical consent,  Pre-op evaluation,  At surgeon's request and post-op pain management  Laterality: Right and Upper  Prep: chloraprep       Needles:  Injection technique: Single-shot  Needle Type: Echogenic Stimulator Needle     Needle Length: 5cm 5 cm Needle Gauge: 21 and 21 G    Additional Needles:  Procedures: ultrasound guided (picture in chart) Supraclavicular block Narrative:  Start time: 11/27/2013 5:55 PM End time: 11/27/2013 6:03 PM Injection made incrementally with aspirations every 5 mL.  Performed by: Personally  Anesthesiologist: Sheldon Silvan   Procedure Name: Intubation Date/Time: 11/27/2013 6:21 PM Performed by: Angelica Pou Pre-anesthesia Checklist: Patient identified, Timeout performed, Emergency Drugs available, Suction available and Patient being monitored Patient Re-evaluated:Patient Re-evaluated prior to inductionOxygen Delivery Method: Circle system utilized Preoxygenation: Pre-oxygenation with 100% oxygen Intubation Type: IV induction Ventilation: Mask ventilation without difficulty Laryngoscope Size: Mac and 3 Grade View: Grade I Tube type: Oral Tube size: 7.5 mm Number of attempts: 1 Airway Equipment and Method: Stylet and Oral airway Placement Confirmation: ETT inserted through vocal cords under direct vision,  breath sounds checked- equal and bilateral and positive ETCO2 Secured at: 22 cm Tube secured with: Tape Dental Injury: Teeth and Oropharynx as per pre-operative assessment

## 2013-11-27 NOTE — ED Notes (Signed)
Ortho Tech called to place long arm splint.

## 2013-11-27 NOTE — Discharge Instructions (Signed)
Go directly to [facility] Short Stay area now.   Do not eat and drink or drink anything between now and then. When you arrive there, tell them that your case was discussed with Dr Melvyn Novas, and he is planning to see you there, and then operate on your elbow.       Elbow Fracture with Open Reduction and Internal Fixation (ORIF) A fracture (break in bone) of the elbow means one of the bones that comprises the elbow is broken. If fractures are not displaced (separated), they may be treated conservatively. That means that only a sling or splint may be required for two to three weeks. Often, elbow fractures are treated by early range of motion exercises to prevent the elbow from getting stiff. If fractures are large and not stable, an operation may be required to put the bones back into proper position and hold them in place with:  Pins.  Plates.  Screws. This is called open reduction and internal fixation (ORIF). The main goal of treating fractured elbows is to get the bones back into position and keep them in place. This goal gives the best chance of an elbow that:  Works as normally as possible.  Has the optimum range of motion. DIAGNOSIS  The diagnosis of a fractured elbow is made by x-ray. These will be required before and after the elbow is fixed. RISKS AND COMPLICATIONS All surgery is associated with risks. Some of these risks are:  Excessive bleeding.  Failure to heal properly (non-union).  Infection.  Stiffness of elbow following injury.  Damage to one of the nerves around the elbow producing numbness or weakness. LET YOUR CAREGIVER KNOW ABOUT:  Allergies.  Medications taken including herbs, eye drops, over the counter medications, and creams.  Use of steroids (by mouth or creams).  Previous problems with anesthetics or novocaine.  Any numbness or tingling in your hand/forearm.  Possibility of pregnancy, if this applies.  History of blood clots  (thrombophlebitis).  History of bleeding or blood problems.  Previous surgery.  Other health problems.  Family history of anesthetic problems PROCEDURE  You will be given an anesthetic which will keep you pain free during surgery. This will be accomplished by a general anesthetic (you go to sleep) or regional anesthesia (your arm is made numb). After the surgery you will be taken to the recovery area where a nurse will monitor your progress. When you are stable, taking fluids well and provided there are no complications, you will be allowed to return to your hospital room or possibly even go home. AFTER THE PROCEDURE   Only take over-the-counter or prescription medicines for pain, discomfort, or fever as directed by your caregiver.  You may use ice for 15-20 minutes, 03-04 times per day, for the first 2 to 3 days.  Change dressings and see your caregiver as directed.  Your caregiver will instruct you when to begin using and exercising your arm and elbow. SEEK IMMEDIATE MEDICAL CARE IF:  There is redness, swelling, or increasing pain in the surgical area.  Pus, blood or unusual drainage is coming from the area or is visible on the dressings or cast.  An unexplained oral temperature above 102 F (38.9 C) develops.  You notice a foul smell coming from the surgical site or dressing.  The wound breaks open (edges are not staying together) after stitches have been removed.  You develop increasing pain or increasing pain with motion of your fingers.  There is numbness or  tingling in your hand or forearm.  You have any other questions or concerns following surgery. Document Released: 11/08/2000 Document Revised: 08/07/2011 Document Reviewed: 06/01/2008 [facility] Patient Information 2015 [employer] [place]. This information is not intended to replace advice given to you by your health care provider. Make sure you discuss any questions you have with your health care provider.

## 2013-11-27 NOTE — ED Notes (Signed)
Pt to radiology.

## 2013-11-27 NOTE — H&P (Signed)
Debbie Sims is an 68 y.o. female.   Chief Complaint: Right elbow injury HPI: Pt had her niece at [facility] [employer] and she took off running after her when she tripped over one of the [employer] things on the floor and fell face forward. Pt states that she tried putting her arms out to catch herself. Pt did hit her face on the ground. Pt c/o right arm pain and has redden area on her nose. Pt denies taking any blood thinners or LOC. Pt seen/evaluated at [facility] Transferred to [facility] for treatment of right elbow fractures   Past Medical History  Diagnosis Date  . Hypertension   . Depression     Past Surgical History  Procedure Laterality Date  . Fracture surgery      left arm    No family history on file. Social History:  reports that she has never smoked. She does not have any smokeless tobacco history on file. She reports that she does not drink alcohol. Her drug history is not on file.  Allergies:  Allergies  Allergen Reactions  . Erythromycin Rash    Medications Prior to Admission  Medication Sig Dispense Refill  . donepezil (ARICEPT) 10 MG tablet Take 10 mg by mouth at bedtime.      . mirtazapine (REMERON) 15 MG tablet Take 15 mg by mouth at bedtime.      . Multiple Vitamin (MULTIVITAMIN WITH MINERALS) TABS tablet Take 1 tablet by mouth daily.      . quinapril (ACCUPRIL) 40 MG tablet Take 40 mg by mouth daily.      . sertraline (ZOLOFT) 100 MG tablet Take 100 mg by mouth daily.        Results for orders placed during the hospital encounter of 11/27/13 (from the past 48 hour(s))  CBC     Status: None   Collection Time    11/27/13  3:57 PM      Result Value Ref Range   WBC 9.5  4.0 - 10.5 K/uL   RBC 4.34  3.87 - 5.11 MIL/uL   Hemoglobin 13.5  12.0 - 15.0 g/dL   HCT 40.9  36.0 - 46.0 %   MCV 94.2  78.0 - 100.0 fL   MCH 31.1  26.0 - 34.0 pg   MCHC 33.0  30.0 - 36.0 g/dL   RDW 14.2  11.5 - 15.5 %   Platelets 250  150 - 400 K/uL  PROTIME-INR      Status: None   Collection Time    11/27/13  3:57 PM      Result Value Ref Range   Prothrombin Time 13.7  11.6 - 15.2 seconds   INR 1.05  0.00 - 1.25  BASIC METABOLIC PANEL     Status: Abnormal   Collection Time    11/27/13  3:57 PM      Result Value Ref Range   Sodium 141  137 - 147 mEq/L   Potassium 4.0  3.7 - 5.3 mEq/L   Chloride 104  96 - 112 mEq/L   CO2 24  19 - 32 mEq/L   Glucose, Bld 155 (*) 70 - 99 mg/dL   BUN 18  6 - 23 mg/dL   Creatinine, Ser 0.79  0.50 - 1.10 mg/dL   Calcium 9.5  8.4 - 10.5 mg/dL   GFR calc non Af Amer 85 (*) >90 mL/min   GFR calc Af Amer >90  >90 mL/min   Comment: (NOTE)  The eGFR has been calculated using the CKD EPI equation.     This calculation has not been validated in all clinical situations.     eGFR's persistently <90 mL/min signify possible Chronic Kidney     Disease.   Anion gap 13  5 - 15   Dg Elbow Complete Right  11/27/2013   CLINICAL DATA:  Status post fall with pain  EXAM: RIGHT ELBOW - COMPLETE 3+ VIEW  COMPARISON:  None.  FINDINGS: There are comminuted displaced fracture of the proximal radius and ulna.  IMPRESSION: Comminuted displaced fractures of the proximal radius and ulna.   Electronically Signed   By: Abelardo Diesel M.D.   On: 11/27/2013 15:09   Dg Forearm Right  11/27/2013   CLINICAL DATA:  Fall  EXAM: RIGHT FOREARM - 2 VIEW  COMPARISON:  None.  FINDINGS: There is a comminuted fracture of the proximal ulnar diaphysis with mild displacement. There is a nondisplaced fracture of the proximal radial neck. There is no other fracture. There is no dislocation. Mild relative widening of the scapholunate joint as can be seen with a scapholunate ligament tear. There is soft tissue swelling overlying the proximal dorsal right forearm.  IMPRESSION: 1. Comminuted fracture of the proximal ulnar diaphysis with mild displacement. 2. Nondisplaced fracture of the proximal radial neck.   Electronically Signed   By: Kathreen Devoid   On: 11/27/2013 15:10    Dg Wrist Complete Right  11/27/2013   CLINICAL DATA:  Fall  EXAM: RIGHT WRIST - COMPLETE 3+ VIEW  COMPARISON:  None.  FINDINGS: No fracture or dislocation is seen.  Mild radiocarpal degenerative changes.  Mild degenerative changes at the 1st carpometacarpal joint.  The visualized soft tissues are unremarkable.  IMPRESSION: No fracture or dislocation is seen.   Electronically Signed   By: Julian Hy M.D.   On: 11/27/2013 15:10   Dg Humerus Right  11/27/2013   CLINICAL DATA:  Status post fall with pain  EXAM: RIGHT HUMERUS - 2+ VIEW  COMPARISON:  None.  FINDINGS: There is no evidence of fracture or dislocation of the right humerus. There are comminuted displaced fractures of the proximal ulna and radius.  IMPRESSION: No fracture of the humerus. Fracture of the proximal ulna and radius.   Electronically Signed   By: Abelardo Diesel M.D.   On: 11/27/2013 15:10    ROS NO RECENT ILLNESSES OR HOSPITALIZATIONS   There were no vitals taken for this visit. Physical Exam  General Appearance:  Alert, cooperative, no distress, appears stated age  Head:  Normocephalic, without obvious abnormality, atraumatic  Eyes:  Pupils equal, conjunctiva/corneas clear,         Throat: Lips, mucosa, and tongue normal; teeth and gums normal  Neck: No visible masses     Lungs:   respirations unlabored  Chest Wall:  No tenderness or deformity  Heart:  Regular rate and rhythm,  Abdomen:   Soft, non-tender,         Extremities: RIGHT ELBOW: SKIN INTACT FINGERS WARM WELL PERFUSED GOOD RADIAL PULSE ABLE TO EXTEND THUMB AND DIGITS ABLE TO CROSS FINGERS  Pulses: 2+ and symmetric  Skin: Skin color, texture, turgor normal, no rashes or lesions     Neurologic: Normal    Assessment/Plan RIGHT ELBOW COMMINUTED PROXIMAL RADIUS AND ULNA FRACTURE  RIGHT ELBOW OPEN REDUCTION AND INTERNAL FIXATION, POSSIBLE RADIAL HEAD ARTHROPLASTY AND RECONSTRUCTION AS INDICATED  R/B/A DISCUSSED WITH PT IN HOSPITAL.  PT VOICED  UNDERSTANDING OF PLAN CONSENT SIGNED  DAY OF SURGERY PT SEEN AND EXAMINED PRIOR TO OPERATIVE PROCEDURE/DAY OF SURGERY SITE MARKED. QUESTIONS ANSWERED WILL REMAIN OVERNIGHT OBSERVATION FOLLOWING SURGERY  [name] 11/27/2013, 4:57 PM

## 2013-11-27 NOTE — ED Notes (Signed)
Pt ret from radiology, family at bedside, denies needs at this time.  NAD.

## 2013-11-28 MED ORDER — PNEUMOCOCCAL VAC POLYVALENT 25 MCG/0.5ML IJ INJ: 0.5000 mL | INJECTION | INTRAMUSCULAR | Status: DC

## 2013-11-28 MED ORDER — OXYCODONE-ACETAMINOPHEN 5-325 MG PO TABS: 1.0000 | ORAL_TABLET | ORAL | Status: DC | PRN

## 2013-11-28 MED ORDER — VITAMIN C 500 MG PO TABS: 500.0000 mg | ORAL_TABLET | Freq: Every day | ORAL | Status: AC

## 2013-11-28 MED ORDER — HYDROCODONE-ACETAMINOPHEN 5-300 MG PO TABS: 1.0000 | ORAL_TABLET | Freq: Four times a day (QID) | ORAL | Status: DC | PRN

## 2013-11-28 MED ORDER — DOCUSATE SODIUM 100 MG PO CAPS: 100.0000 mg | ORAL_CAPSULE | Freq: Two times a day (BID) | ORAL | Status: DC

## 2013-11-28 NOTE — Progress Notes (Signed)
UR Completed.  Licia Harl Jane [phone] 11/28/2013  

## 2013-11-28 NOTE — Discharge Summary (Signed)
Physician Discharge Summary  Patient ID: Debbie Sims MRN: [id number] DOB/AGE: Sep 11, 1945 68 y.o.  Admit date: 11/27/2013 Discharge date: 11/28/2013  Admission Diagnoses: Right Elbow Fracture Past Medical History  Diagnosis Date  . Hypertension   . Depression   . Complication of anesthesia     slow to wake up    Discharge Diagnoses:  Active Problems:   Right radial head fracture   Surgeries: Procedure(s): OPEN REDUCTION INTERNAL FIXATION (ORIF) ELBOW/OLECRANON FRACTURE and Radial head replacement on 11/27/2013    Consultants:    Discharged Condition: Improved  Hospital Course: Debbie Sims is an 68 y.o. female who was admitted 11/27/2013 with a chief complaint of No chief complaint on file. , and found to have a diagnosis of Right Elbow Fracture.  They were brought to the operating room on 11/27/2013 and underwent Procedure(s): OPEN REDUCTION INTERNAL FIXATION (ORIF) ELBOW/OLECRANON FRACTURE and Radial head replacement.    They were given perioperative antibiotics: Anti-infectives   Start     Dose/Rate Route Frequency Ordered Stop   11/28/13 0600  ceFAZolin (ANCEF) IVPB 2 g/50 mL premix     2 g 100 mL/hr over 30 Minutes Intravenous On call to O.R. 11/27/13 1705 11/28/13 0645   11/28/13 0200  ceFAZolin (ANCEF) IVPB 1 g/50 mL premix     1 g 100 mL/hr over 30 Minutes Intravenous 3 times per day 11/27/13 2220     11/27/13 2230  ceFAZolin (ANCEF) IVPB 1 g/50 mL premix  Status:  Discontinued     1 g 100 mL/hr over 30 Minutes Intravenous NOW 11/27/13 2220 11/27/13 2224   11/27/13 1729  ceFAZolin (ANCEF) 2-3 GM-% IVPB SOLR    Comments:  Roney Mans   : cabinet override      11/27/13 1729 11/28/13 0544    .  They were given sequential compression devices, early ambulation, and Other (comment)ambulation for DVT prophylaxis.  Recent vital signs: Patient Vitals for the past 24 hrs:  BP Temp Temp src Pulse Resp SpO2 Height Weight  11/28/13 0527 123/67 mmHg 98.1 F (36.7  C) Oral 74 16 97 % - -  11/28/13 0216 110/60 mmHg 98.1 F (36.7 C) Oral 79 16 96 % - -  11/27/13 2203 136/85 mmHg 98.4 F (36.9 C) - 80 16 96 % - -  11/27/13 2143 140/86 mmHg 98 F (36.7 C) - 81 15 98 % - -  11/27/13 2130 142/88 mmHg - - 80 16 96 % - -  11/27/13 2115 145/85 mmHg - - 87 17 96 % - -  11/27/13 2100 145/84 mmHg - - 88 12 95 % - -  11/27/13 2054 144/98 mmHg 98.3 F (36.8 C) - 91 13 95 % - -  11/27/13 1656 164/94 mmHg 98.1 F (36.7 C) Oral 70 18 96 % 5\' 3"  (1.6 m) 53.524 kg (118 lb)  .  Recent laboratory studies: Dg Elbow Complete Right  11/27/2013   CLINICAL DATA:  Status post fall with pain  EXAM: RIGHT ELBOW - COMPLETE 3+ VIEW  COMPARISON:  None.  FINDINGS: There are comminuted displaced fracture of the proximal radius and ulna.  IMPRESSION: Comminuted displaced fractures of the proximal radius and ulna.   Electronically Signed   By: Sherian Rein M.D.   On: 11/27/2013 15:09   Dg Forearm Right  11/27/2013   CLINICAL DATA:  Fall  EXAM: RIGHT FOREARM - 2 VIEW  COMPARISON:  None.  FINDINGS: There is a comminuted fracture of the proximal ulnar diaphysis with mild  displacement. There is a nondisplaced fracture of the proximal radial neck. There is no other fracture. There is no dislocation. Mild relative widening of the scapholunate joint as can be seen with a scapholunate ligament tear. There is soft tissue swelling overlying the proximal dorsal right forearm.  IMPRESSION: 1. Comminuted fracture of the proximal ulnar diaphysis with mild displacement. 2. Nondisplaced fracture of the proximal radial neck.   Electronically Signed   By: Elige Ko   On: 11/27/2013 15:10   Dg Wrist Complete Right  11/27/2013   CLINICAL DATA:  Fall  EXAM: RIGHT WRIST - COMPLETE 3+ VIEW  COMPARISON:  None.  FINDINGS: No fracture or dislocation is seen.  Mild radiocarpal degenerative changes.  Mild degenerative changes at the 1st carpometacarpal joint.  The visualized soft tissues are unremarkable.   IMPRESSION: No fracture or dislocation is seen.   Electronically Signed   By: Charline Bills M.D.   On: 11/27/2013 15:10   Dg Humerus Right  11/27/2013   CLINICAL DATA:  Status post fall with pain  EXAM: RIGHT HUMERUS - 2+ VIEW  COMPARISON:  None.  FINDINGS: There is no evidence of fracture or dislocation of the right humerus. There are comminuted displaced fractures of the proximal ulna and radius.  IMPRESSION: No fracture of the humerus. Fracture of the proximal ulna and radius.   Electronically Signed   By: Sherian Rein M.D.   On: 11/27/2013 15:10    Discharge Medications:     Medication List    TAKE these medications       donepezil 10 MG tablet  Commonly known as:  ARICEPT  Take 10 mg by mouth at bedtime.     mirtazapine 15 MG tablet  Commonly known as:  REMERON  Take 15 mg by mouth at bedtime.     multivitamin with minerals Tabs tablet  Take 1 tablet by mouth daily.     quinapril 40 MG tablet  Commonly known as:  ACCUPRIL  Take 40 mg by mouth daily.     sertraline 100 MG tablet  Commonly known as:  ZOLOFT  Take 100 mg by mouth daily.      ASK your doctor about these medications       VITAMIN D PO  Take 1 tablet by mouth daily.        Diagnostic Studies: Dg Elbow Complete Right  11/27/2013   CLINICAL DATA:  Status post fall with pain  EXAM: RIGHT ELBOW - COMPLETE 3+ VIEW  COMPARISON:  None.  FINDINGS: There are comminuted displaced fracture of the proximal radius and ulna.  IMPRESSION: Comminuted displaced fractures of the proximal radius and ulna.   Electronically Signed   By: Sherian Rein M.D.   On: 11/27/2013 15:09   Dg Forearm Right  11/27/2013   CLINICAL DATA:  Fall  EXAM: RIGHT FOREARM - 2 VIEW  COMPARISON:  None.  FINDINGS: There is a comminuted fracture of the proximal ulnar diaphysis with mild displacement. There is a nondisplaced fracture of the proximal radial neck. There is no other fracture. There is no dislocation. Mild relative widening of the  scapholunate joint as can be seen with a scapholunate ligament tear. There is soft tissue swelling overlying the proximal dorsal right forearm.  IMPRESSION: 1. Comminuted fracture of the proximal ulnar diaphysis with mild displacement. 2. Nondisplaced fracture of the proximal radial neck.   Electronically Signed   By: Elige Ko   On: 11/27/2013 15:10   Dg Wrist Complete Right  11/27/2013   CLINICAL DATA:  Fall  EXAM: RIGHT WRIST - COMPLETE 3+ VIEW  COMPARISON:  None.  FINDINGS: No fracture or dislocation is seen.  Mild radiocarpal degenerative changes.  Mild degenerative changes at the 1st carpometacarpal joint.  The visualized soft tissues are unremarkable.  IMPRESSION: No fracture or dislocation is seen.   Electronically Signed   By: Charline BillsSriyesh  Krishnan M.D.   On: 11/27/2013 15:10   Dg Humerus Right  11/27/2013   CLINICAL DATA:  Status post fall with pain  EXAM: RIGHT HUMERUS - 2+ VIEW  COMPARISON:  None.  FINDINGS: There is no evidence of fracture or dislocation of the right humerus. There are comminuted displaced fractures of the proximal ulna and radius.  IMPRESSION: No fracture of the humerus. Fracture of the proximal ulna and radius.   Electronically Signed   By: Sherian ReinWei-Chen  Lin M.D.   On: 11/27/2013 15:10    They benefited maximally from their hospital stay and there were no complications.     Disposition: 01-Home or Self Care      Follow-up Information   Schedule an appointment as soon as possible for a visit with Sharma CovertTMANN,Chrissi Crow W, MD.   Specialty:  Orthopedic Surgery   Contact information:   [address] [place] [id number] [phone]        Signed: Sharma CovertORTMANN,Khelani Kops W 11/28/2013, 10:03 AM

## 2013-11-28 NOTE — Op Note (Signed)
NAMMarca Sims:  Dotson, Elfreida              ACCOUNT NO.:  [id number]  MEDICAL RECORD NO.:  [id number]  LOCATION:  [id number]                        FACILITY:  [facility]  PHYSICIAN:  Madelynn DoneFred W Lilianne Delair IV, MD  DATE OF BIRTH:  08/13/1945  DATE OF PROCEDURE:  11/27/2013 DATE OF DISCHARGE:                              OPERATIVE REPORT   PREOPERATIVE DIAGNOSIS:  Left elbow Monteggia-variant fracture, dislocation of the elbow.  POSTOPERATIVE DIAGNOSIS:  Left elbow Monteggia-variant fracture, dislocation of the elbow.  ATTENDING SURGEON:  Madelynn DoneFred W Adisen Bennion IV, MD, who scrubbed and present for the entire procedure.  ASSISTANT SURGEON:  None.  ANESTHESIA:  General via LMA as well as supraclavicular block performed by Anesthesia.  SURGICAL PROCEDURES: 1. Open treatment of left elbow proximal ulna fracture with internal     fixation, Monteggia fracture, dislocation. 2. Left elbow proximal radius radial head fixation with radial head     arthroplasty. 3. Radiographs, 3 views, left elbow.  SURGICAL IMPLANTS:  Biomet proximal olecranon plate with combination of locking and nonlocking screws, as well as the Biomet radial head implant, 24-mm head, 6-mm stem.  Biological implant 5 mL of Biomet Stagraft.  RADIOGRAPHIC INTERPRETATION:  Three views of the elbow did show the radial head arthroplasty and proximal olecranon fixation in place. There was good position with the bone graft in place at the proximal olecranon deficit.  SURGICAL INDICATIONS:  Mrs. Carmelia Bakemmann is a 68 year old female who was sat at [facility] when she was running after a family member child where she tripped and fell, and landed on an outstretched right arm.  The patient was [facility] and [facility] and [facility] for [facility] treatment.  Risks, benefits and alternatives were discussed in detail with the patient and signed informed consent was obtained.  Risks include, but are not limited  to bleeding; infection; damage to nearby nerves, arteries, or tendons; loss of motion of the wrist and digits; incomplete relief of symptoms; nonunion; malunion; malrotation; and need for further surgical intervention.  DESCRIPTION OF PROCEDURE:  The patient was properly identified in the preoperative holding area and marked with a permanent marker made on the right elbow to indicate the correct operative site.  The patient was then brought back to the operating room, placed supine on anesthesia room table, where general anesthesia was administered.  The patient tolerated this well.  A well-padded tourniquet was then placed on the right brachium and sealed with 1000-drape.  Right upper extremity was then prepped and draped in normal sterile fashion.  Time-out was called, correct side was identified and procedure was then begun.  Attention was then turned to the right elbow with the arm was then draped over the chest.  The patient had SCDs, all pressure points were well padded.  A curvilinear incision was made around the olecranon tip, curving radially.  Limb was then elevated and then tourniquet insufflated. Dissection was carried down through the skin and subcutaneous tissue. The fascial layer was incised directly over the proximal olecranon exposing the highly comminuted proximal olecranon fracture.  A small interval was then made laterally exposing the radial head.  The patient did have the comminuted, type 3  radial head fracture with a fracture through the radial neck and the head essentially fell out.  Following this, the radial head was then exposed.  The shaft was then exposed and then broaching of the canal was then carried out, beginning 5 mm, extending up to 6 mm.  Trial implants were then placed and then the after trial implanting, the final implant was then chosen and felt to be good stability.  The wound was then irrigated.  Attention was then turned to the proximal  olecranon, Monteggia variant, where the open reduction was then performed.  The patient had blown out both the radial and medial columns of the proximal olecranon.  Cortical pieces were still remained with soft tissue attachments were left.  Several cortical fragments were removed without any soft tissue connections.  The wound was irrigated.  Fracture hematoma was then evacuated.  An open reduction was then performed and the proximal olecranon plate was then applied. This was held temporarily in placed in K-wires and confirmed using mini C-arm.  Following this, the locking screws were then placed with the appropriate drilling and depth gauge measurement.  Then, distal fixation was then carried out, loading the plate in compression with the oblong screw hole distally.  Once this was carried out, this was confirmed using the mini C-arm.  Distal fixation was then achieved in the shaft and then the home-run screw or oblique screw was then placed proximally under the guide of the mini C-arm.  Appropriate depth gauge measurement was then used to confirming the placement out of the joint and engaging the anterior cortices of the proximal olecranon.  Two more locking screws were then placed proximal to the fracture site.  5 mL of Stagraft was then packed into the metaphyseal deficit.  The wound was then irrigated.  The fascial interval laterally was then closed with 0 Vicryl, the fascia over the plate was then closed with 0 and 2-0 Vicryl, subcutaneous tissue was closed with 4-0 Vicryl, skin closed with skin staples.  Large Xeroform dressing was then applied.  Sterile compressive bandage was then applied.  The patient was then placed in a long-arm splint, extubated, and taken to the recovery room in good condition.  POSTPROCEDURAL PLAN:  The patient was admitted overnight for IV antibiotics and pain control, discharge once her pain was controlled. She is up ambulating with therapy.  Plan to see  her back in approximately 2 weeks for wound check, x-rays and then begin an outpatient therapy regimen with a long-arm splint and begin some gentle early active range of motion.     Surgical implants; Biomet radial head arthroplasty with a 24-mm and 6-mm stem and a proximal olecranon plate with a combination of locking and nonlocking screws.     Madelynn Done, MD     FWO/MEDQ  D:  11/27/2013  T:  11/28/2013  Job:  [id number]

## 2013-12-01 ENCOUNTER — Encounter (HOSPITAL_COMMUNITY): Payer: Self-pay | Admitting: Orthopedic Surgery

## 2013-12-16 ENCOUNTER — Encounter: Payer: Self-pay | Admitting: Neurology

## 2013-12-16 ENCOUNTER — Ambulatory Visit (INDEPENDENT_AMBULATORY_CARE_PROVIDER_SITE_OTHER): Payer: Medicare Other | Admitting: Neurology

## 2013-12-16 VITALS — BP 122/78 | HR 88 | Ht 61.5 in | Wt 128.0 lb

## 2013-12-16 DIAGNOSIS — F09 Unspecified mental disorder due to known physiological condition: Secondary | ICD-10-CM

## 2013-12-16 DIAGNOSIS — R4189 Other symptoms and signs involving cognitive functions and awareness: Secondary | ICD-10-CM

## 2013-12-16 NOTE — Progress Notes (Signed)
[facility]    Provider:  Dr Hosie Poisson Referring Provider: Sigmund Hazel, MD Primary Care Physician:  Neldon Labella, MD  CC:  Cognitive decline  First noticed a few years ago. Patient and family note a progressive worsening. Initially noted she was misplacing things, forgetting how to do things. Lives by self. Has difficulty managing her finances, her son is in the process of taking over the finances. Denies any difficulty with getting the right words out.Notes no difficulty with sleep. No hallucinations. Is currently not driving but plans on starting in the future. Notes some difficulty with getting around places, has trouble recalling where places are. Notes remote memory is overall pretty good. No history of strokes or TIAs. Started on Aricept around 2 years ago. No change since starting this medication. No EtOH use for 2 years. Prior to that she would drink nightly, typically 2 vodka tonics and wine nightly for "years". Otherwise healthy.   Family notes concerns over difficulty with short term memory, trouble taking care of herself. Has trouble processing information and keeping track of orders/directions. Constantly misplacing things around the house. Difficulty with her attention span. Fixates on certain things.   Uncle developed AD in his 68s.   She reports that her vitamin B levels have been low in the past.    Review of Systems: Out of a complete 14 system review, the patient complains of only the following symptoms, and all other reviewed systems are negative. + confusion, fatigue  History   Social History  . Marital Status: Single    Spouse Name: N/A    Number of Children: 1  . Years of Education: 12+   Occupational History  . Retired    Social History Main Topics  . Smoking status: Never Smoker   . Smokeless tobacco: Never Used  . Alcohol Use: No  . Drug Use: Not on file  . Sexual Activity: Not on file   Other Topics Concern  . Not on file    Social History Narrative   Patient lives at home alone    Patient is retired.   Patient has a Chief Operating Officer.    Patient has 1 child.    Patient is single     Family History  Problem Relation Age of Onset  . Prostate cancer Father   . Arthritis Mother   . Stroke Mother     Past Medical History  Diagnosis Date  . Hypertension   . Depression   . Complication of anesthesia     slow to wake up    Past Surgical History  Procedure Laterality Date  . Appendectomy    . Orif elbow fracture Right 11/27/2013    Procedure: OPEN REDUCTION INTERNAL FIXATION (ORIF) ELBOW/OLECRANON FRACTURE and Radial head replacement;  Surgeon: Sharma Covert, MD;  Location: [facility] OR;  Service: Orthopedics;  Laterality: Right;    Current Outpatient Prescriptions  Medication Sig Dispense Refill  . Cholecalciferol (VITAMIN D PO) Take 1 tablet by mouth daily.       Marland Kitchen donepezil (ARICEPT) 10 MG tablet Take 10 mg by mouth at bedtime.      . mirtazapine (REMERON) 15 MG tablet Take 15 mg by mouth at bedtime.      . Multiple Vitamin (MULTIVITAMIN WITH MINERALS) TABS tablet Take 1 tablet by mouth daily.      . quinapril (ACCUPRIL) 40 MG tablet Take 40 mg by mouth daily.      . sertraline (ZOLOFT) 100 MG tablet Take 100 mg by  mouth daily.      . vitamin C (ASCORBIC ACID) 500 MG tablet Take 1 tablet (500 mg total) by mouth daily.  50 tablet  0   No current facility-administered medications for this visit.    Allergies as of 12/16/2013 - Review Complete 12/16/2013  Allergen Reaction Noted  . Erythromycin Rash 06/11/2013    Vitals: BP 122/78  Pulse 88  Ht 5' 1.5" (1.562 m)  Wt 128 lb (58.06 kg)  BMI 23.80 kg/m2 Last Weight:  Wt Readings from Last 1 Encounters:  12/16/13 128 lb (58.06 kg)   Last Height:   Ht Readings from Last 1 Encounters:  12/16/13 5' 1.5" (1.562 m)     Physical exam: Exam: Gen: NAD, conversant Eyes: anicteric sclerae, moist conjunctivae HENT: Atraumatic, oropharynx clear Neck:  Trachea midline; supple,  Lungs: CTA, no wheezing, rales, rhonic                          CV: RRR, no MRG Abdomen: Soft, non-tender;  Extremities: No peripheral edema  Skin: Normal temperature, no rash,  Psych: Appropriate affect, pleasant  Neuro: MS:  MOCA 13/30  CN: PERRL, EOMI no nystagmus, no ptosis, sensation intact to LT V1-V3 bilat, face symmetric, no weakness, hearing grossly intact, palate elevates symmetrically, shoulder shrug 5/5 bilat,  tongue protrudes midline, no fasiculations noted.  Motor: normal bulk and tone Strength: 5/5  In all extremities  Coord: rapid alternating and point-to-point (FNF, HTS) movements intact.  Reflexes: symmetrical, bilat downgoing toes  Sens: LT intact in all extremities  Gait: posture, stance, stride and arm-swing normal. Tandem gait intact. Able to walk on heels and toes. Romberg absent.   Assessment:  After physical and neurologic examination, review of laboratory studies, imaging, neurophysiology testing and pre-existing records, assessment will be reviewed on the problem list.  Plan:  Treatment plan and additional workup will be reviewed under Problem List.  1)Cognitive decline  68y/o woman presenting for initial evaluation of progressive cognitive decline. She has a history of heavy EtOH usage but quit a few years ago, otherwise unremarkable past medical history. Reports recently having B12 and TSH levels checked, this will be faxed to our office. Will check MRI brain. Patient has uncle with diagnosis of AD. Will continue Aricept 10mg  daily for now. Can consider addition of Namenda in the future. WIll follow up once lab work received and MRI completed.   Elspeth ChoPeter Hamilton Marinello, DO  [facility] [address] [place], [place] [address]  Phone [phone] Fax [phone]

## 2013-12-16 NOTE — Patient Instructions (Addendum)
Overall you are doing fairly well but I do want to suggest a few things today:   Remember to drink plenty of fluid, eat healthy meals and do not skip any meals. Try to eat protein with a every meal and eat a healthy snack such as fruit or nuts in between meals. Try to keep a regular sleep-wake schedule and try to exercise daily, particularly in the form of walking, 20-30 minutes a day, if you can.   As far as your medications are concerned, I would like to suggest the following: 1)Please continue on the Aricept 10mg  daily 2)Please decrease the sertraline to 1/2 tablet (50mg ) daily  Please have your lab results from Dr. Hyacinth Meeker faxed over to our office.   We will follow up once the MRI is completed. Please call us with any interim questions, concerns, problems, updates or refill requests.   My clinical assistant and will answer any of your questions and relay your messages to me and also relay most of my messages to you.   Our phone number is [phone]. We also have an after hours call service for urgent matters and there is a physician on-call for urgent questions. For any emergencies you know to call 911 or go to the nearest emergency room

## 2013-12-24 ENCOUNTER — Ambulatory Visit
Admission: RE | Admit: 2013-12-24 | Discharge: 2013-12-24 | Disposition: A | Discharge status: Home / Self Care | Payer: Medicare Other | Source: Ambulatory Visit | Attending: Neurology | Admitting: Neurology

## 2013-12-24 DIAGNOSIS — R4189 Other symptoms and signs involving cognitive functions and awareness: Secondary | ICD-10-CM

## 2013-12-24 DIAGNOSIS — F09 Unspecified mental disorder due to known physiological condition: Secondary | ICD-10-CM

## 2013-12-25 NOTE — Progress Notes (Signed)
Quick Note:  Shared MR Brain results with patient's sister, she verbalized understanding and scheduled a f/u with Dr Hosie Poisson, for next steps going forward ______

## 2013-12-29 ENCOUNTER — Encounter: Payer: Self-pay | Admitting: Neurology

## 2013-12-29 ENCOUNTER — Ambulatory Visit (INDEPENDENT_AMBULATORY_CARE_PROVIDER_SITE_OTHER): Payer: Medicare Other | Admitting: Neurology

## 2013-12-29 VITALS — BP 120/84 | HR 73 | Ht 61.5 in | Wt 127.0 lb

## 2013-12-29 DIAGNOSIS — R4189 Other symptoms and signs involving cognitive functions and awareness: Secondary | ICD-10-CM

## 2013-12-29 DIAGNOSIS — F09 Unspecified mental disorder due to known physiological condition: Secondary | ICD-10-CM

## 2013-12-29 NOTE — Progress Notes (Signed)
[facility]    Provider:  Dr Hosie Poisson Referring Provider: Sigmund Hazel, MD Primary Care Physician:  Neldon Labella, MD  CC:  Cognitive decline  Returns for a follow up today with no change since last visit 11/2013. Had brain MRI which was overall unremarkable. Continues to have difficulty with short term memory. No new concerns at visit today.   Initial visit 11/2013: First noticed a few years ago. Patient and family note a progressive worsening. Initially noted she was misplacing things, forgetting how to do things. Lives by self. Has difficulty managing her finances, her son is in the process of taking over the finances. Denies any difficulty with getting the right words out.Notes no difficulty with sleep. No hallucinations. Is currently not driving but plans on starting in the future. Notes some difficulty with getting around places, has trouble recalling where places are. Notes remote memory is overall pretty good. No history of strokes or TIAs. Started on Aricept around 2 years ago. No change since starting this medication. No EtOH use for 2 years. Prior to that she would drink nightly, typically 2 vodka tonics and wine nightly for "years". Otherwise healthy.   Family notes concerns over difficulty with short term memory, trouble taking care of herself. Has trouble processing information and keeping track of orders/directions. Constantly misplacing things around the house. Difficulty with her attention span. Fixates on certain things.   Uncle developed AD in his 48s.   She reports that her vitamin B levels have been low in the past.    Review of Systems: Out of a complete 14 system review, the patient complains of only the following symptoms, and all other reviewed systems are negative. + confusion, fatigue  History   Social History  . Marital Status: Single    Spouse Name: N/A    Number of Children: 1  . Years of Education: 12+   Occupational History  .  Retired    Social History Main Topics  . Smoking status: Never Smoker   . Smokeless tobacco: Never Used  . Alcohol Use: No  . Drug Use: Not on file  . Sexual Activity: Not on file   Other Topics Concern  . Not on file   Social History Narrative   Patient lives at home alone    Patient is retired.   Patient has a Chief Operating Officer.    Patient has 1 child.    Patient is single     Family History  Problem Relation Age of Onset  . Prostate cancer Father   . Arthritis Mother   . Stroke Mother     Past Medical History  Diagnosis Date  . Hypertension   . Depression   . Complication of anesthesia     slow to wake up    Past Surgical History  Procedure Laterality Date  . Appendectomy    . Orif elbow fracture Right 11/27/2013    Procedure: OPEN REDUCTION INTERNAL FIXATION (ORIF) ELBOW/OLECRANON FRACTURE and Radial head replacement;  Surgeon: Sharma Covert, MD;  Location: [facility] OR;  Service: Orthopedics;  Laterality: Right;    Current Outpatient Prescriptions  Medication Sig Dispense Refill  . Cholecalciferol (VITAMIN D PO) Take 1 tablet by mouth daily.       Marland Kitchen donepezil (ARICEPT) 10 MG tablet Take 10 mg by mouth at bedtime.      . mirtazapine (REMERON) 15 MG tablet Take 15 mg by mouth at bedtime.      . Multiple Vitamin (MULTIVITAMIN WITH MINERALS) TABS  tablet Take 1 tablet by mouth daily.      . quinapril (ACCUPRIL) 40 MG tablet Take 40 mg by mouth daily.      . sertraline (ZOLOFT) 100 MG tablet Take 100 mg by mouth daily.      . vitamin C (ASCORBIC ACID) 500 MG tablet Take 1 tablet (500 mg total) by mouth daily.  50 tablet  0   No current facility-administered medications for this visit.    Allergies as of 12/29/2013 - Review Complete 12/29/2013  Allergen Reaction Noted  . Erythromycin Rash 06/11/2013    Vitals: BP 120/84  Pulse 73  Ht 5' 1.5" (1.562 m)  Wt 127 lb (57.607 kg)  BMI 23.61 kg/m2 Last Weight:  Wt Readings from Last 1 Encounters:  12/29/13 127 lb (57.607  kg)   Last Height:   Ht Readings from Last 1 Encounters:  12/29/13 5' 1.5" (1.562 m)     Physical exam: Exam: Gen: NAD, conversant Eyes: anicteric sclerae, moist conjunctivae HENT: Atraumatic, oropharynx clear Neck: Trachea midline; supple,  Lungs: CTA, no wheezing, rales, rhonic                          CV: RRR, no MRG Abdomen: Soft, non-tender;  Extremities: No peripheral edema  Skin: Normal temperature, no rash,  Psych: Appropriate affect, pleasant  Neuro: MS:  MOCA 13/30 at prior visit  CN: PERRL, EOMI no nystagmus, no ptosis, sensation intact to LT V1-V3 bilat, face symmetric, no weakness, hearing grossly intact, palate elevates symmetrically, shoulder shrug 5/5 bilat,  tongue protrudes midline, no fasiculations noted.  Motor: normal bulk and tone Strength: 5/5  In all extremities  Coord: rapid alternating and point-to-point (FNF, HTS) movements intact.  Reflexes: symmetrical, bilat downgoing toes  Sens: LT intact in all extremities  Gait: posture, stance, stride and arm-swing normal. Tandem gait intact. Able to walk on heels and toes. Romberg absent.   Assessment:  After physical and neurologic examination, review of laboratory studies, imaging, neurophysiology testing and pre-existing records, assessment will be reviewed on the problem list.  Plan:  Treatment plan and additional workup will be reviewed under Problem List.  1)Cognitive decline  68y/o woman presenting for follow up evaluation of progressive cognitive decline. She has a history of heavy EtOH usage but quit a few years ago, otherwise unremarkable past medical history. MRI of the brain was overall unremarkable. She denies any sleep difficulties, depression appears well controlled. Have concern that her cognitive decline may be related to an underlying neurodegenerative process such as AD. Discussed this extensively with patient and her family. They expressed understanding. Will recheck B12 and MMA as  she reports low level in the past. Will refer to neuro-psychology for formal cognitive testing. Can consider addition of Namenda in the future. Follow up as needed.    Debbie ChoPeter Vinia Jemmott, DO  [facility] [address] [place], [place] [address]  Phone [phone] Fax [phone]

## 2013-12-29 NOTE — Patient Instructions (Signed)
Overall you are doing fairly well but I do want to suggest a few things today:   Remember to drink plenty of fluid, eat healthy meals and do not skip any meals. Try to eat protein with a every meal and eat a healthy snack such as fruit or nuts in between meals. Try to keep a regular sleep-wake schedule and try to exercise daily, particularly in the form of walking, 20-30 minutes a day, if you can.   As far as your medications are concerned, I would like to suggest  As far as diagnostic testing:  1)Please have some blood work done today 2)I would like you to have formal cognitive testing completed with neuro-psychology. You will be called to schedule this  Please check out the [employer]. There website is [email or web address]  I would like to see you back as needed. Please call us with any interim questions, concerns, problems, updates or refill requests.   My clinical assistant and will answer any of your questions and relay your messages to me and also relay most of my messages to you.   Our phone number is [phone]. We also have an after hours call service for urgent matters and there is a physician on-call for urgent questions. For any emergencies you know to call 911 or go to the nearest emergency room

## 2013-12-30 LAB — METHYLMALONIC ACID(MMA), RND URINE

## 2013-12-30 LAB — VITAMIN B12: Vitamin B-12: 354 pg/mL (ref 211–946)

## 2014-03-18 ENCOUNTER — Telehealth: Payer: Self-pay | Admitting: Neurology

## 2014-03-18 NOTE — Telephone Encounter (Signed)
Prior Dr. Hosie Poisson patient. Spoke to patient's sister who stated patient's memory decline has progressed and not much anyone can do. She appreciated the call and will call us back if they need [place]. 03/18/2014 gb

## 2014-06-11 ENCOUNTER — Encounter (HOSPITAL_COMMUNITY): Payer: Self-pay | Admitting: Orthopedic Surgery

## 2015-07-20 ENCOUNTER — Encounter: Payer: Self-pay | Admitting: Neurology

## 2015-07-20 ENCOUNTER — Ambulatory Visit (INDEPENDENT_AMBULATORY_CARE_PROVIDER_SITE_OTHER): Payer: Medicare Other | Admitting: Neurology

## 2015-07-20 VITALS — BP 134/98 | HR 65 | Ht 62.0 in | Wt 125.5 lb

## 2015-07-20 DIAGNOSIS — R413 Other amnesia: Secondary | ICD-10-CM | POA: Diagnosis not present

## 2015-07-20 DIAGNOSIS — E538 Deficiency of other specified B group vitamins: Secondary | ICD-10-CM | POA: Diagnosis not present

## 2015-07-20 HISTORY — DX: Other amnesia: R41.3

## 2015-07-20 MED ORDER — MEMANTINE HCL 28 X 5 MG & 21 X 10 MG PO TABS: ORAL_TABLET | ORAL | Status: DC

## 2015-07-20 NOTE — Progress Notes (Signed)
Reason for visit: Memory disturbance  Referring physician: Dr. Cherrie Sims is a 70 y.o. female  History of present illness:  Debbie Sims is a 70 year old right-handed white female with a history of a progressive memory disturbance. The patient was seen in 2015 by Dr. Hosie Sims. MRI brain evaluation at that time was read out as being normal, but by my review appears to show diffuse cortical atrophy. The patient has a history of alcohol overuse in the past, and she continues to drink wine on a regular basis, the family indicates that a bottle of wine will last for about 2 days. The patient lives alone, but she lives next to her mother. The mother cooks, the patient herself does not. The patient does operate a motor vehicle, she reports some problems with directions but indicates that she only drives short distances. She has not had any motor vehicle accidents. The sister has taken over managing the medication, the patient has a pill dispenser, but she still does not remember to take her medications regularly. The son manages the finances at this time. The son has had to do this for about 6 months. The patient is not sleeping well, she denies any fatigue during the day, however. The patient denies any gait instability problems, or difficulty controlling the bowels or the bladder. The patient does have a history of vitamin B12 deficiency, she is on B12 supplementation currently. The patient denies any numbness or weakness of the face, arms, or legs. She is sent to this office for an evaluation. At times, she does demonstrate some paranoid behavior, she is on Aricept currently tolerating the medication well.  Past Medical History  Diagnosis Date  . Hypertension   . Depression   . Complication of anesthesia     slow to wake up  . Memory difficulty 07/20/2015  . Vitamin B12 deficiency     Past Surgical History  Procedure Laterality Date  . Appendectomy    . Orif elbow fracture Right  11/27/2013    Procedure: OPEN REDUCTION INTERNAL FIXATION (ORIF) ELBOW/OLECRANON FRACTURE and Radial head replacement;  Surgeon: Debbie Covert, MD;  Location: [facility] OR;  Service: Orthopedics;  Laterality: Right;    Family History  Problem Relation Age of Onset  . Prostate cancer Father   . Heart disease Father   . Kidney disease Father   . Dementia Father   . Arthritis Mother   . Stroke Mother   . Dementia Maternal Aunt   . Dementia Maternal Uncle     Social history:  reports that she has never smoked. She has never used smokeless tobacco. She reports that she drinks about 1.2 oz of alcohol per week. She reports that she does not use illicit drugs.  Medications:  Prior to Admission medications   Medication Sig Start Date End Date Taking? Authorizing Provider  Cholecalciferol (VITAMIN D PO) Take 1 tablet by mouth daily.    Yes Historical Provider, MD  donepezil (ARICEPT) 10 MG tablet Take 10 mg by mouth at bedtime.   Yes Historical Provider, MD  mirtazapine (REMERON) 15 MG tablet Take 15 mg by mouth at bedtime.   Yes Historical Provider, MD  Multiple Vitamin (MULTIVITAMIN WITH MINERALS) TABS tablet Take 1 tablet by mouth daily.   Yes Historical Provider, MD  quinapril (ACCUPRIL) 40 MG tablet Take 40 mg by mouth daily.   Yes Historical Provider, MD  sertraline (ZOLOFT) 100 MG tablet Take 100 mg by mouth daily.   Yes  Historical Provider, MD  vitamin C (ASCORBIC ACID) 500 MG tablet Take 1 tablet (500 mg total) by mouth daily. 11/28/13  Yes Debbie Bienenstock, MD      Allergies  Allergen Reactions  . Amoxicillin Other (See Comments)  . Trazodone Other (See Comments)  . Erythromycin Rash    ROS:  Out of a complete 14 system review of symptoms, the patient complains only of the following symptoms, and all other reviewed systems are negative.  Memory loss, confusion Depression, anxiety, disinterest in activities Insomnia  Blood pressure 134/98, pulse 65, height 5\' 2"  (1.575 m), weight 125  lb 8 oz (56.926 kg).  Physical Exam  General: The patient is alert and cooperative at the time of the examination.  Eyes: Pupils are equal, round, and reactive to light. Discs are flat bilaterally.  Neck: The neck is supple, no carotid bruits are noted.  Respiratory: The respiratory examination is clear.  Cardiovascular: The cardiovascular examination reveals a regular rate and rhythm, no obvious murmurs or rubs are noted.  Skin: Extremities are without significant edema.  Neurologic Exam  Mental status: The patient is alert and oriented x 2 at the time of the examination (not oriented to date). The Mini-Mental Status Examination done today shows a total score of 17/30. The patient is able to name 5 animals in 30 seconds..  Cranial nerves: Facial symmetry is present. There is good sensation of the face to pinprick and soft touch bilaterally. The strength of the facial muscles and the muscles to head turning and shoulder shrug are normal bilaterally. Speech is well enunciated, no aphasia or dysarthria is noted. Extraocular movements are full. Visual fields are full. The tongue is midline, and the patient has symmetric elevation of the soft palate. No obvious hearing deficits are noted.  Motor: The motor testing reveals 5 over 5 strength of all 4 extremities. Good symmetric motor tone is noted throughout.  Sensory: Sensory testing is intact to pinprick, soft touch, vibration sensation, and position sense on all 4 extremities. No evidence of extinction is noted.  Coordination: Cerebellar testing reveals good finger-nose-finger and heel-to-shin bilaterally.  Gait and station: Gait is normal. Tandem gait is normal. Romberg is negative. No drift is seen.  Reflexes: Deep tendon reflexes are symmetric and normal bilaterally. Toes are downgoing bilaterally.   MRI brain 12/24/13:  IMPRESSION:  Unremarkable MRI brain (without).   * MRI scan images were reviewed online. I agree with the  written report, with the exception that there appears to be some diffuse cortical atrophy.    Assessment/Plan:  1. Progressive memory disturbance, probable Alzheimer's disease  2. History of alcohol overuse  3. History of vitamin B12 deficiency  The patient is on Aricept currently, we will add Namenda. I have indicated that the patient should plan for the future, and look into assisted living facilities. She was somewhat resistant to this idea. I have indicated that she should not be operating a motor vehicle at this time. We will follow-up in about 6 months. We will check blood work today.  Marlan Palau MD 07/20/2015 8:07 PM  [facility] [address] [place], [place] [address]  Phone [phone] Fax [phone]

## 2015-07-21 ENCOUNTER — Other Ambulatory Visit: Payer: Self-pay | Admitting: Neurology

## 2015-07-21 ENCOUNTER — Encounter: Payer: Self-pay | Admitting: Neurology

## 2015-07-21 DIAGNOSIS — G309 Alzheimer's disease, unspecified: Secondary | ICD-10-CM

## 2015-07-21 DIAGNOSIS — F0280 Dementia in other diseases classified elsewhere without behavioral disturbance: Secondary | ICD-10-CM

## 2015-07-21 DIAGNOSIS — G300 Alzheimer's disease with early onset: Principal | ICD-10-CM

## 2015-07-21 HISTORY — DX: Dementia in other diseases classified elsewhere, unspecified severity, without behavioral disturbance, psychotic disturbance, mood disturbance, and anxiety: F02.80

## 2015-07-22 ENCOUNTER — Telehealth: Payer: Self-pay

## 2015-07-22 LAB — VITAMIN B12: VITAMIN B 12: 340 pg/mL (ref 211–946)

## 2015-07-22 LAB — RPR: RPR: NONREACTIVE

## 2015-07-22 LAB — HIV ANTIBODY (ROUTINE TESTING W REFLEX): HIV Screen 4th Generation wRfx: NONREACTIVE

## 2015-07-22 LAB — COPPER, SERUM: Copper: 127 ug/dL (ref 72–166)

## 2015-07-22 LAB — SEDIMENTATION RATE: SED RATE: 5 mm/h (ref 0–40)

## 2015-07-22 NOTE — Telephone Encounter (Signed)
Pt returned Kelby's call. Relay to pt labs were nl. Pt understood and had no questions.

## 2015-07-22 NOTE — Telephone Encounter (Signed)
-----   Message from York Spaniel, MD sent at 07/22/2015  7:38 AM EST -----  The blood work results are unremarkable. Please call the sister.  ----- Message -----    From: [facility] Lab Results In Interface    Sent: 07/21/2015   7:44 AM      To: York Spaniel, MD

## 2015-07-22 NOTE — Telephone Encounter (Signed)
[employer] approved PA through 05-28-16. Reference #: [id number]. Phone #: [phone]. Patient ID: [id number].

## 2015-07-22 NOTE — Telephone Encounter (Signed)
I called the patient and left voicemail asking she call back for lab results. When she calls, please advise labs are normal.

## 2015-08-27 ENCOUNTER — Other Ambulatory Visit: Payer: Self-pay | Admitting: Neurology

## 2015-08-27 MED ORDER — MEMANTINE HCL 10 MG PO TABS: 10.0000 mg | ORAL_TABLET | Freq: Two times a day (BID) | ORAL | Status: DC

## 2015-08-27 NOTE — Telephone Encounter (Signed)
Pt's sister said there is 2 days left of memantine (NAMENDA TITRATION PAK) tablet pack . Please send new RX.

## 2015-08-30 NOTE — Telephone Encounter (Signed)
Approved.  

## 2015-10-18 ENCOUNTER — Telehealth: Payer: Self-pay | Admitting: *Deleted

## 2015-10-18 NOTE — Telephone Encounter (Signed)
Taken 22-MAY-17 at  1:31PM by DRS ------------------------------------------------------------ Harland Dingwall             CID [id number]  Patient [name],Debbie Sims     Pt's Dr Anne Hahn       Area Code 336 Phone# [phone] * DOB 2046/05/08    RE HAS QUESTIONS ABOUT HER SISTERS MEDICATION        ALT [phone]                                   Disp:Y/N N If Y = C/B If No Response In ============================================================

## 2015-10-18 NOTE — Telephone Encounter (Signed)
Spoke to pt's sister who asked if pt was to continue donepezil along w/ taking memantine. Let her kinow that at last OV, Dr. Anne Hahn added memantine to the donepezil that pt was already taking. Sister inquired about combination pill, Namzeric. Says that she will check to see if pt still has donepezil left to take. Will then call back if she needs refills or to discuss change to Namzeric.

## 2016-01-24 ENCOUNTER — Encounter: Payer: Self-pay | Admitting: Neurology

## 2016-01-24 ENCOUNTER — Ambulatory Visit (INDEPENDENT_AMBULATORY_CARE_PROVIDER_SITE_OTHER): Payer: Medicare Other | Admitting: Neurology

## 2016-01-24 VITALS — BP 154/100 | HR 58 | Ht 62.5 in | Wt 117.0 lb

## 2016-01-24 DIAGNOSIS — F0280 Dementia in other diseases classified elsewhere without behavioral disturbance: Secondary | ICD-10-CM | POA: Diagnosis not present

## 2016-01-24 DIAGNOSIS — G300 Alzheimer's disease with early onset: Secondary | ICD-10-CM

## 2016-01-24 MED ORDER — MEMANTINE HCL-DONEPEZIL HCL ER 28-10 MG PO CP24: 1.0000 | ORAL_CAPSULE | Freq: Every day | ORAL | 5 refills | Status: DC

## 2016-01-24 NOTE — Progress Notes (Signed)
Reason for visit: Memory loss  Debbie Sims is an 70 y.o. female  History of present illness:  Debbie Sims is a 70 year old right-handed white female with a progressive memory disturbance. The patient has been on a combination of Namenda and Aricept, she is tolerating the medication well. She no longer operates a motor vehicle. She can walk to the grocery store and to a restaurant if she needs to. The patient actually does very little cooking. She has not given up any activities of daily living other than driving a car since last seen. She returns to this office for an evaluation.  Past Medical History:  Diagnosis Date  . Alzheimer's disease 07/21/2015   Moderate severity  . Complication of anesthesia    slow to wake up  . Depression   . Hypertension   . Memory difficulty 07/20/2015  . Vitamin B12 deficiency     Past Surgical History:  Procedure Laterality Date  . APPENDECTOMY    . ORIF ELBOW FRACTURE Right 11/27/2013   Procedure: OPEN REDUCTION INTERNAL FIXATION (ORIF) ELBOW/OLECRANON FRACTURE and Radial head replacement;  Surgeon: Sharma Covert, MD;  Location: [facility] OR;  Service: Orthopedics;  Laterality: Right;    Family History  Problem Relation Age of Onset  . Prostate cancer Father   . Heart disease Father   . Kidney disease Father   . Dementia Father   . Arthritis Mother   . Stroke Mother   . Dementia Maternal Aunt   . Dementia Maternal Uncle     Social history:  reports that she has never smoked. She has never used smokeless tobacco. She reports that she drinks about 1.2 oz of alcohol per week . She reports that she does not use drugs.    Allergies  Allergen Reactions  . Amoxicillin Other (See Comments)  . Trazodone Other (See Comments)  . Erythromycin Rash    Medications:  Prior to Admission medications   Medication Sig Start Date End Date Taking? Authorizing Provider  Cholecalciferol (VITAMIN D PO) Take 1 tablet by mouth daily.    Yes Historical  Provider, MD  donepezil (ARICEPT) 10 MG tablet Take 10 mg by mouth at bedtime.   Yes Historical Provider, MD  Melatonin 5 MG TABS 1 tablet at bedtime as needed with food   Yes Historical Provider, MD  memantine (NAMENDA TITRATION PAK) tablet pack 5 mg/day for =1 week; 5 mg twice daily for =1 week; 15 mg/day given in 5 mg and 10 mg separated doses for =1 week; then 10 mg twice daily 07/20/15  Yes York Spaniel, MD  memantine (NAMENDA) 10 MG tablet Take 1 tablet (10 mg total) by mouth 2 (two) times daily. 08/27/15  Yes York Spaniel, MD  Multiple Vitamin (MULTIVITAMIN WITH MINERALS) TABS tablet Take 1 tablet by mouth daily.   Yes Historical Provider, MD  quinapril (ACCUPRIL) 40 MG tablet Take 40 mg by mouth daily.   Yes Historical Provider, MD  sertraline (ZOLOFT) 100 MG tablet Take 100 mg by mouth daily.   Yes Historical Provider, MD  vitamin A 86754 UNIT capsule as directed 12/05/13  Yes Historical Provider, MD  vitamin C (ASCORBIC ACID) 500 MG tablet Take 1 tablet (500 mg total) by mouth daily. 11/28/13  Yes Bradly Bienenstock, MD    ROS:  Out of a complete 14 system review of symptoms, the patient complains only of the following symptoms, and all other reviewed systems are negative.  Memory loss Confusion, depression  Blood  pressure (!) 154/100, pulse (!) 58, height 5' 2.5" (1.588 m), weight 117 lb (53.1 kg).  Physical Exam  General: The patient is alert and cooperative at the time of the examination.  Skin: No significant peripheral edema is noted.   Neurologic Exam  Mental status: The patient is alert and oriented x 2 at the time of the examination (not oriented to date). The Mini-Mental Status Examination done today shows a total score of 14/30.   Cranial nerves: Facial symmetry is present. Speech is normal, no aphasia or dysarthria is noted. Extraocular movements are full. Visual fields are full.  Motor: The patient has good strength in all 4 extremities.  Sensory examination:  Soft touch sensation is symmetric on the face, arms, and legs.  Coordination: The patient has good finger-nose-finger and heel-to-shin bilaterally.  Gait and station: The patient has a normal gait. Tandem gait is unsteady. Romberg is negative. No drift is seen.  Reflexes: Deep tendon reflexes are symmetric.   Assessment/Plan:  1. Memory disturbance  The family indicates that twice daily medications are difficult to maintain. We will switch from Aricept and Namzaric. A prescription was called in. The patient will follow-up in 6 months, sooner if needed. The patient is getting some supervision with her medications, appointments, and finances.  Marlan Palau. Keith Willis MD 01/24/2016 2:48 PM  [facility] [address] [place], [place] [id number]  Phone [phone] Fax [phone]

## 2016-02-02 ENCOUNTER — Emergency Department (HOSPITAL_COMMUNITY): Payer: Medicare Other

## 2016-02-02 ENCOUNTER — Emergency Department (HOSPITAL_COMMUNITY)
Admission: EM | Admit: 2016-02-02 | Discharge: 2016-02-02 | Disposition: A | Discharge status: Home / Self Care | Payer: Medicare Other | Attending: Emergency Medicine | Admitting: Emergency Medicine

## 2016-02-02 ENCOUNTER — Encounter (HOSPITAL_COMMUNITY): Payer: Self-pay

## 2016-02-02 DIAGNOSIS — W19XXXA Unspecified fall, initial encounter: Secondary | ICD-10-CM

## 2016-02-02 DIAGNOSIS — S32010A Wedge compression fracture of first lumbar vertebra, initial encounter for closed fracture: Secondary | ICD-10-CM | POA: Diagnosis not present

## 2016-02-02 DIAGNOSIS — Y929 Unspecified place or not applicable: Secondary | ICD-10-CM | POA: Diagnosis not present

## 2016-02-02 DIAGNOSIS — W182XXA Fall in (into) shower or empty bathtub, initial encounter: Secondary | ICD-10-CM | POA: Diagnosis not present

## 2016-02-02 DIAGNOSIS — S32000A Wedge compression fracture of unspecified lumbar vertebra, initial encounter for closed fracture: Secondary | ICD-10-CM

## 2016-02-02 DIAGNOSIS — G309 Alzheimer's disease, unspecified: Secondary | ICD-10-CM | POA: Insufficient documentation

## 2016-02-02 DIAGNOSIS — Y999 Unspecified external cause status: Secondary | ICD-10-CM | POA: Insufficient documentation

## 2016-02-02 DIAGNOSIS — Y939 Activity, unspecified: Secondary | ICD-10-CM | POA: Diagnosis not present

## 2016-02-02 DIAGNOSIS — I10 Essential (primary) hypertension: Secondary | ICD-10-CM | POA: Diagnosis not present

## 2016-02-02 DIAGNOSIS — Z79899 Other long term (current) drug therapy: Secondary | ICD-10-CM | POA: Diagnosis not present

## 2016-02-02 DIAGNOSIS — S3992XA Unspecified injury of lower back, initial encounter: Secondary | ICD-10-CM | POA: Diagnosis present

## 2016-02-02 MED ORDER — KETOROLAC TROMETHAMINE 30 MG/ML IJ SOLN
15.0000 mg | Freq: Once | INTRAMUSCULAR | Status: AC
  Administered 2016-02-02: 15 mg via INTRAMUSCULAR
  Filled 2016-02-02: qty 1

## 2016-02-02 MED ORDER — HYDROCODONE-ACETAMINOPHEN 5-325 MG PO TABS: 1.0000 | ORAL_TABLET | Freq: Four times a day (QID) | ORAL | 0 refills | Status: DC | PRN

## 2016-02-02 NOTE — ED Notes (Signed)
Pt is moving in the stretcher freely without difficulty.  States she is having pain in her back.  When asked to rate her pain, pt looked at her sister and asked what her pain rate would be.

## 2016-02-02 NOTE — Discharge Instructions (Signed)
As discussed, it is important that you follow up with our physicians for continued management of your condition.  If you develop any new, or concerning changes in your condition, please return to the emergency department immediately.

## 2016-02-02 NOTE — ED Triage Notes (Signed)
Per EMS, Pt, from home, c/o lower back and R hip pain after a slip and fall in the shower last night/early this morning.  Pain score 8/10.  No deformity noted.  Full ROM noted.  Pt given Fentanyl en route.

## 2016-02-02 NOTE — ED Provider Notes (Signed)
[facility]-EMERGENCY DEPT Provider Note   CSN: [id number] Arrival date & time: 02/02/16  0948     History   Chief Complaint Chief Complaint  Patient presents with  . Fall  . Back Pain  . Hip Pain    HPI Debbie Sims is a 70 y.o. female.  HPI  Patient presents after a fall. Patient is here with her sister who provides history of present illness, patient has a history of Alzheimer's. Patient was on the third floor of an apartment. Seemingly, the patient fell in shower, this morning. The patient herself complains of pain in the lower back, denies head pain, neck pain, chest pain, belly pain, denies loss of consciousness, though it is unclear if this occurred or not.  My sister reports that the patient is interacting in a typical manner, has been doing so since she found her this morning. Patient required transfer on a spinal immobilization stretcher, with cervical collar in place.  Patient is a notable history of prior lumbar compression fracture with kyphoplasty.   Past Medical History:  Diagnosis Date  . Alzheimer's disease 07/21/2015   Moderate severity  . Complication of anesthesia    slow to wake up  . Depression   . Hypertension   . Memory difficulty 07/20/2015  . Vitamin B12 deficiency     Patient Active Problem List   Diagnosis Date Noted  . Alzheimer's disease 07/21/2015  . Memory difficulty 07/20/2015  . Cognitive decline 12/16/2013  . Right radial head fracture 11/27/2013    Past Surgical History:  Procedure Laterality Date  . APPENDECTOMY    . ORIF ELBOW FRACTURE Right 11/27/2013   Procedure: OPEN REDUCTION INTERNAL FIXATION (ORIF) ELBOW/OLECRANON FRACTURE and Radial head replacement;  Surgeon: Sharma Covert, MD;  Location: [facility] OR;  Service: Orthopedics;  Laterality: Right;    OB History    No data available       Home Medications    Prior to Admission medications   Medication Sig Start Date End Date Taking? Authorizing Provider  Cholecalciferol  (VITAMIN D PO) Take 1 tablet by mouth daily.    Yes Historical Provider, MD  Melatonin 5 MG TABS 1 tablet at bedtime as needed with food   Yes Historical Provider, MD  Memantine HCl-Donepezil HCl (NAMZARIC) 28-10 MG CP24 Take 1 capsule by mouth daily. 01/24/16  Yes York Spaniel, MD  quinapril (ACCUPRIL) 40 MG tablet Take 40 mg by mouth daily.   Yes Historical Provider, MD  sertraline (ZOLOFT) 100 MG tablet Take 100 mg by mouth daily.   Yes Historical Provider, MD  HYDROcodone-acetaminophen (NORCO/VICODIN) 5-325 MG tablet Take 1 tablet by mouth every 6 (six) hours as needed for severe pain. 02/02/16   Gerhard Munch, MD  vitamin C (ASCORBIC ACID) 500 MG tablet Take 1 tablet (500 mg total) by mouth daily. Patient not taking: Reported on 02/02/2016 11/28/13   Bradly Bienenstock, MD    Family History Family History  Problem Relation Age of Onset  . Prostate cancer Father   . Heart disease Father   . Kidney disease Father   . Dementia Father   . Arthritis Mother   . Stroke Mother   . Dementia Maternal Aunt   . Dementia Maternal Uncle     Social History Social History  Substance Use Topics  . Smoking status: Never Smoker  . Smokeless tobacco: Never Used  . Alcohol use 1.2 oz/week    2 Standard drinks or equivalent per week     Allergies  Amoxicillin; Trazodone; and Erythromycin   Review of Systems Review of Systems  Unable to perform ROS: Dementia     Physical Exam Updated Vital Signs BP (!) 166/104 (BP Location: Right Arm)   Pulse 62   Temp 98.6 F (37 C) (Oral)   Resp 18   Ht 5' 2.5" (1.588 m)   Wt 117 lb (53.1 kg)   SpO2 97%   BMI 21.06 kg/m   Physical Exam  Constitutional: She appears well-developed and well-nourished. No distress.  HENT:  Head: Normocephalic and atraumatic.  Eyes: Conjunctivae and EOM are normal.  Neck: Full passive range of motion without pain. No spinous process tenderness and no muscular tenderness present. No neck rigidity. Normal range of  motion present.  Cardiovascular: Normal rate and regular rhythm.   Pulmonary/Chest: Effort normal and breath sounds normal. No stridor. No respiratory distress.  Abdominal: She exhibits no distension.  Musculoskeletal: She exhibits no edema.       Arms: Patient is moving both legs spontaneously, flexing each hip independently, to commands and randomly. Hips, ankles, knees unremarkable, bilaterally.  Neurological: She is alert. She displays no atrophy and no tremor. No cranial nerve deficit. She exhibits normal muscle tone. She displays no seizure activity. Coordination normal.  Skin: Skin is warm and dry.  Psychiatric: She has a normal mood and affect. Cognition and memory are impaired.  Nursing note and vitals reviewed.    ED Treatments / Results  Labs (all labs ordered are listed, but only abnormal results are displayed) Labs Reviewed - No data to display  EKG  EKG Interpretation None       Radiology Ct Lumbar Spine Wo Contrast  Result Date: 02/02/2016 CLINICAL DATA:  Status post fall at home.  Low back pain. EXAM: CT LUMBAR SPINE WITHOUT CONTRAST TECHNIQUE: Multidetector CT imaging of the lumbar spine was performed without intravenous contrast administration. Multiplanar CT image reconstructions were also generated. COMPARISON:  MR lumbar spine 06/20/2005 FINDINGS: Segmentation: 5 lumbar type vertebrae. Alignment: 2 mm of anterolisthesis of L4 on L5 secondary to facet disease. Vertebrae: Chronic T12 vertebral body compression fracture with methylmethacrylate within the T12 vertebral body from prior augmentation. 5 mm of retropulsion of the superior posterior margin of the T12 vertebral body with mild impression on the thecal sac. L1 vertebral body compression fracture with 40% central height loss and a fracture cleft involving the posterior cortex of the L1 vertebral body. Remainder the vertebral body heights are maintained. No aggressive lytic or sclerotic osseous lesion. Paraspinal  and other soft tissues: No paraspinal abnormality. Partially visualized right renal cyst. Abdominal aortic atherosclerosis. Disc levels: Insert MR lumbar Disc heights are relatively well maintained. Bilateral facet arthropathy at T11-12. Bilateral facet arthropathy at T12-L1. Mild bilateral facet arthropathy at L1-2. Mild broad-based disc bulge at L1-2. Moderate bilateral facet arthropathy at L2-3. Mild broad-based disc bulge at L2-3. Mild broad-based disc bulge at L3-4. Severe right and mild left facet arthropathy at L3-4. Severe bilateral facet arthropathy at L4-5. Moderate bilateral facet arthropathy at L5-S1. No significant foraminal stenosis of the lumbar spine. IMPRESSION: 1. Acute L1 vertebral body compression fracture with 40% central height loss and a fracture cleft involving the posterior cortex of the L1 vertebral body. 2. Chronic T12 vertebral body compression fracture. Electronically Signed   By: Elige Ko   On: 02/02/2016 12:35    Procedures Procedures (including critical care time)  Medications Ordered in ED Medications  ketorolac (TORADOL) 30 MG/ML injection 15 mg (15 mg Intramuscular Given 02/02/16  1126)     Initial Impression / Assessment and Plan / ED Course  I have reviewed the triage vital signs and the nursing notes.  Pertinent labs & imaging results that were available during my care of the patient were reviewed by me and considered in my medical decision making (see chart for details).  Clinical Course    On repeat exam the patient is in no distress, has been ambulatory. We discussed all findings, with the patient's sister and brother-in-law present. Patient will follow up with neurosurgery.  This elderly female presents after mechanical fall with ongoing back pain. Patient is distally neurovascularly intact in all 4 extremities, has no evidence for cord compromise. Patient appropriate analgesia here, was discharged with oral narcotics to follow-up with spine  surgery.   Final Clinical Impressions(s) / ED Diagnoses   Final diagnoses:  Fall, initial encounter  Compression fracture of lumbar vertebra, closed, initial encounter (HCC)    New Prescriptions New Prescriptions   HYDROCODONE-ACETAMINOPHEN (NORCO/VICODIN) 5-325 MG TABLET    Take 1 tablet by mouth every 6 (six) hours as needed for severe pain.     Gerhard Munchobert Zenobia Kuennen, MD 02/02/16 [phone]

## 2016-02-02 NOTE — ED Notes (Signed)
MD at bedside. 

## 2016-03-05 IMAGING — CR DG WRIST COMPLETE 3+V*R*
5 series · 5 of 5 positions shown · non-contrast
Comparison: None.

CLINICAL DATA: Fall

EXAM:
RIGHT WRIST - COMPLETE 3+ VIEW

[x wrist pa right]
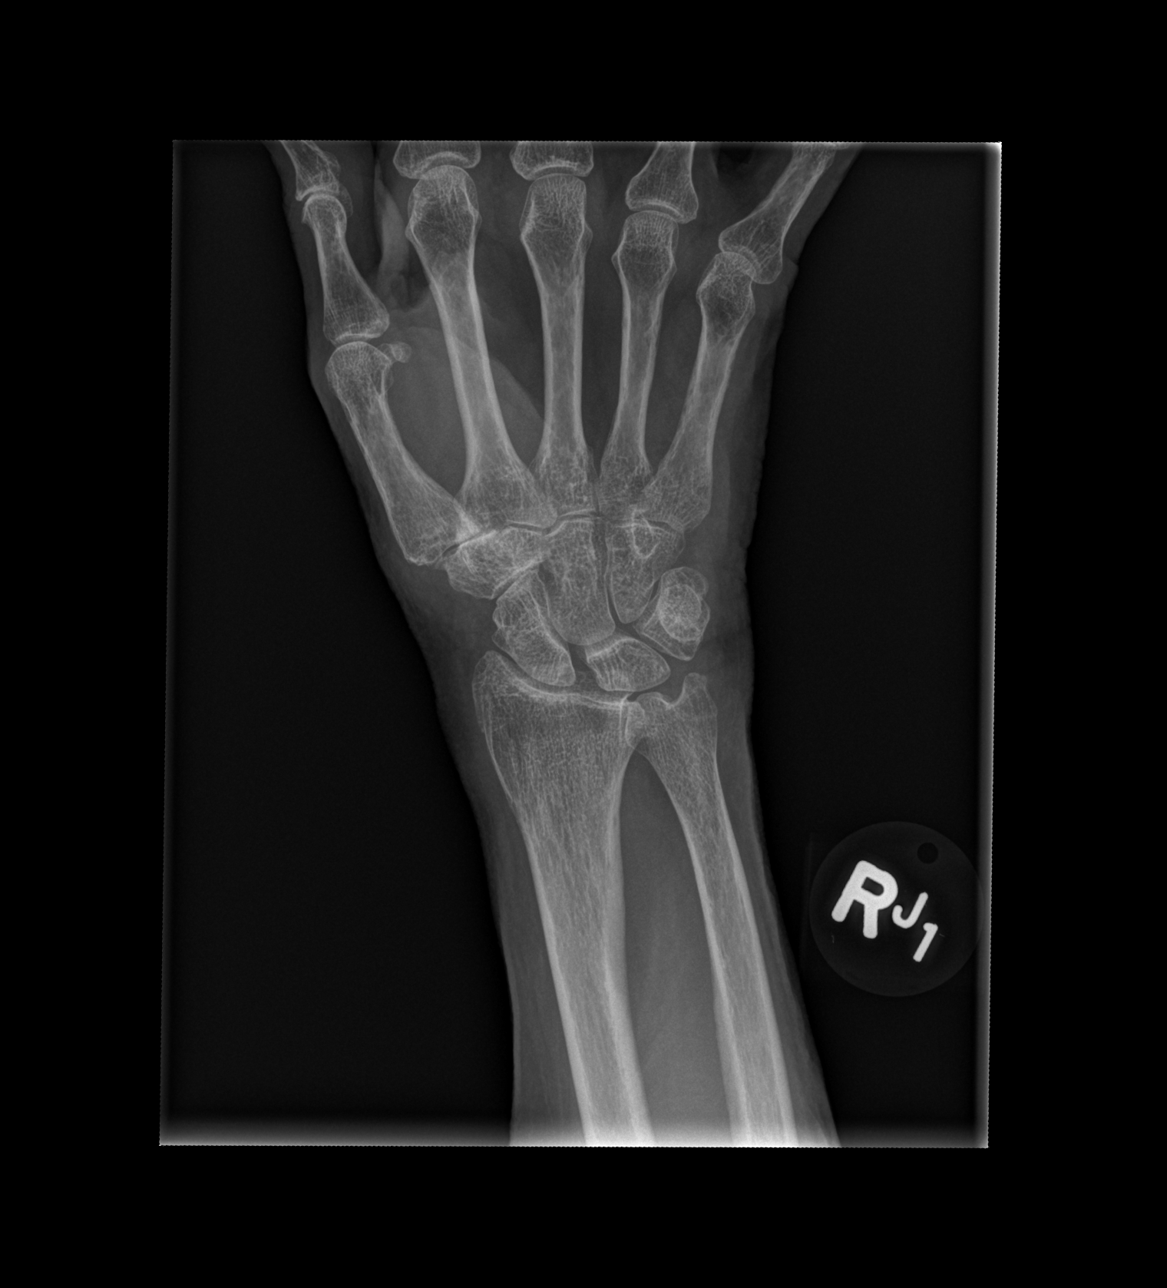

[x wrist obl right]
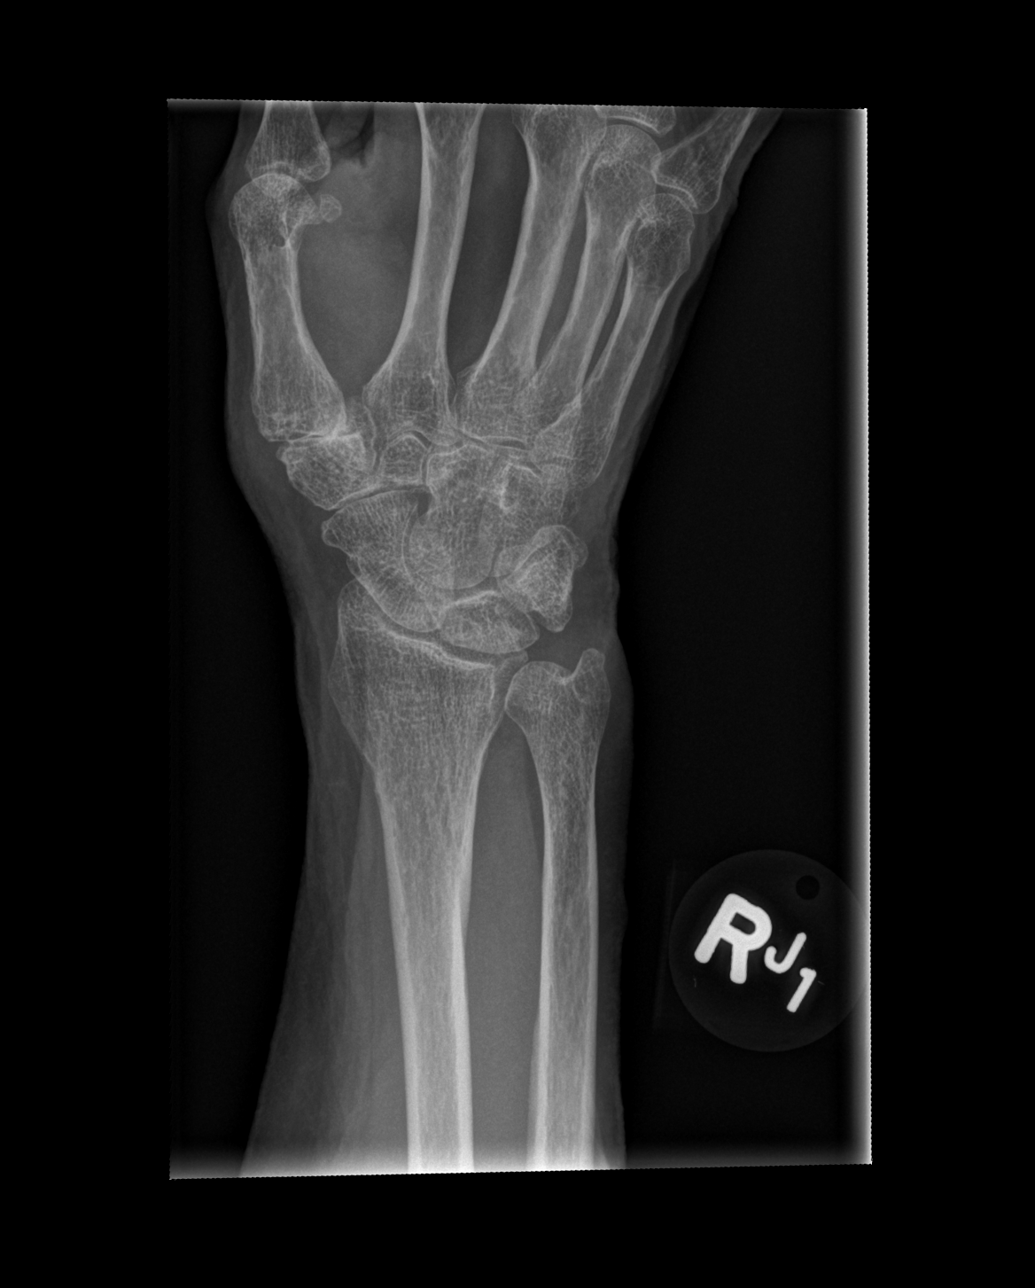

[x wrist lat right (1 of 2)]
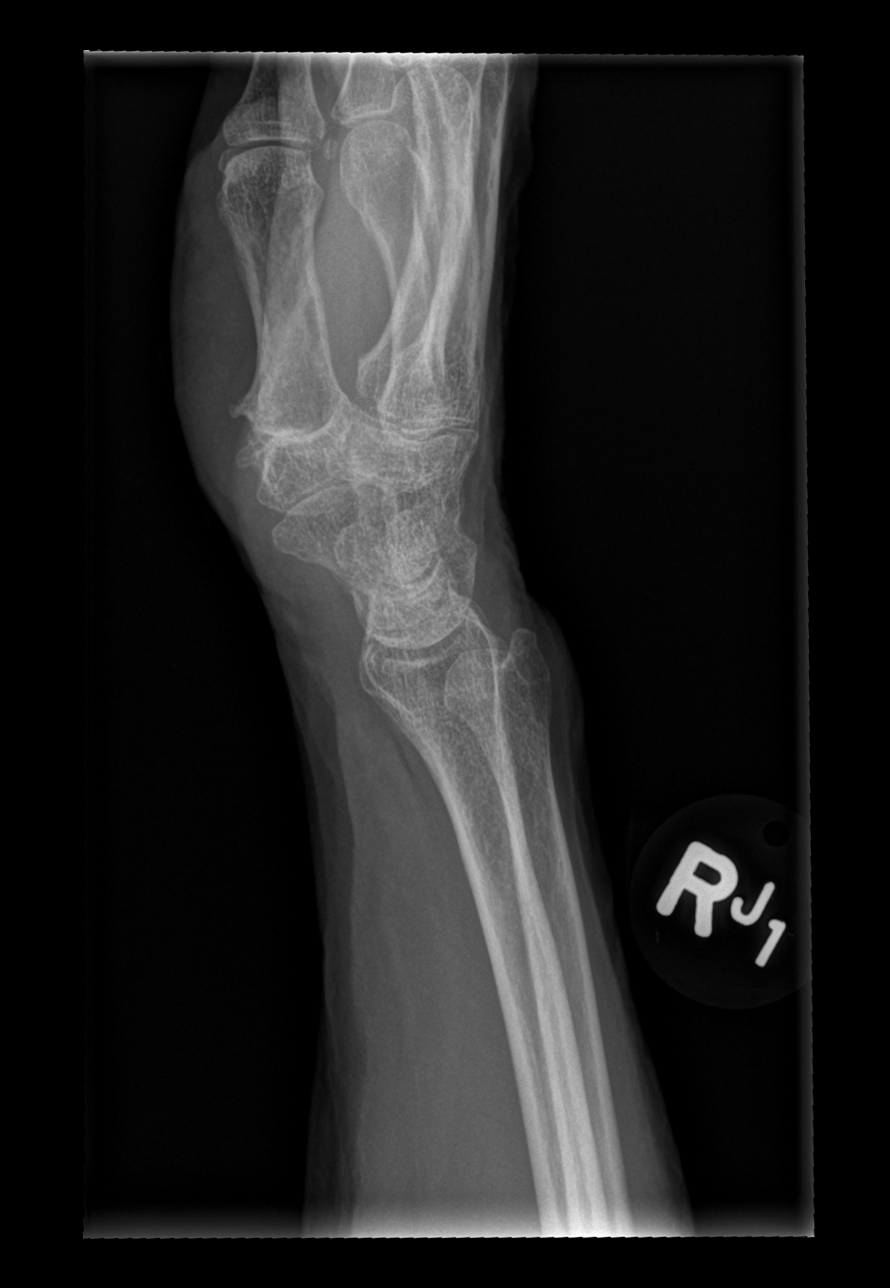

[x wrist lat right (2 of 2)]
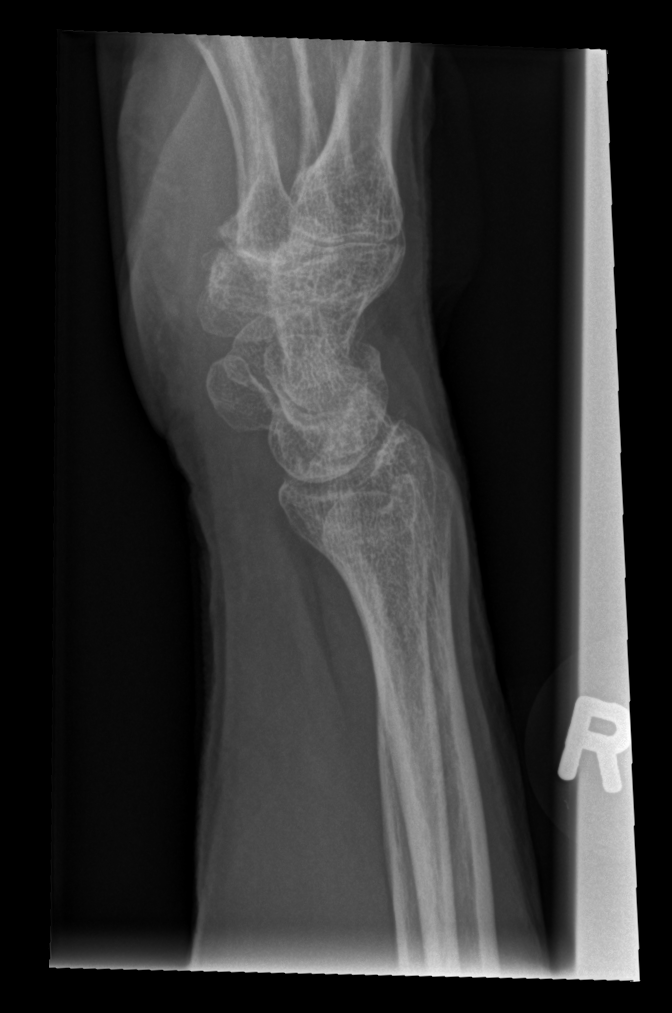

[x wrist navicular view right]
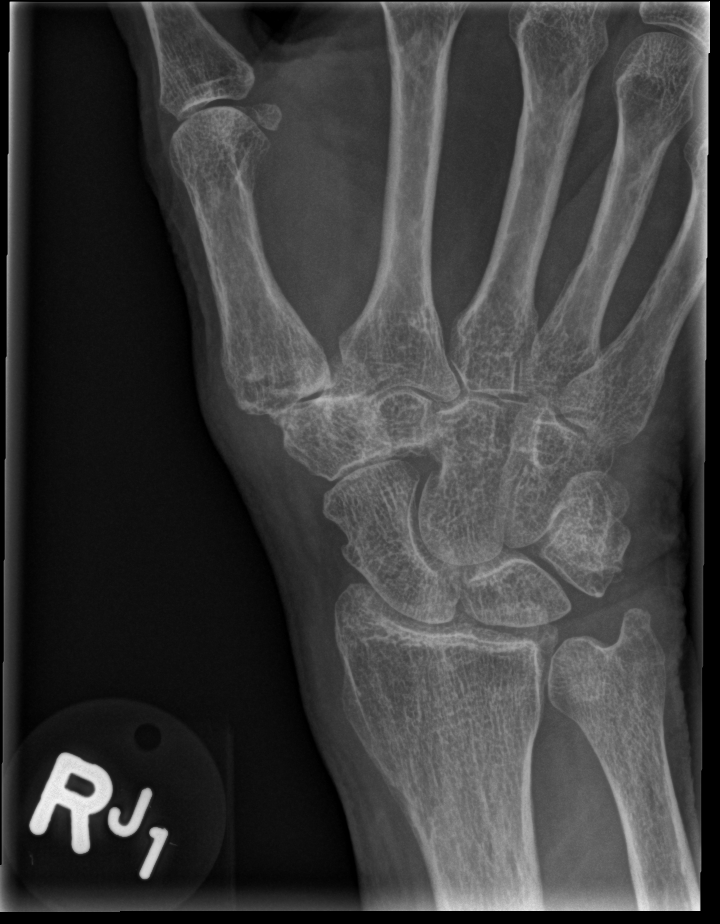

[5 of 5 positions shown; findings below may reference images not displayed]

FINDINGS: No fracture or dislocation is seen.

Mild radiocarpal degenerative changes.

Mild degenerative changes at the 1st carpometacarpal joint.

The visualized soft tissues are unremarkable.
IMPRESSION: No fracture or dislocation is seen.

## 2016-03-05 IMAGING — CR DG HUMERUS 2V *R*
2 series · 2 of 2 positions shown · non-contrast
Comparison: None.

CLINICAL DATA: Status post fall with pain

EXAM:
RIGHT HUMERUS - 2+ VIEW

[t humerus lat right]
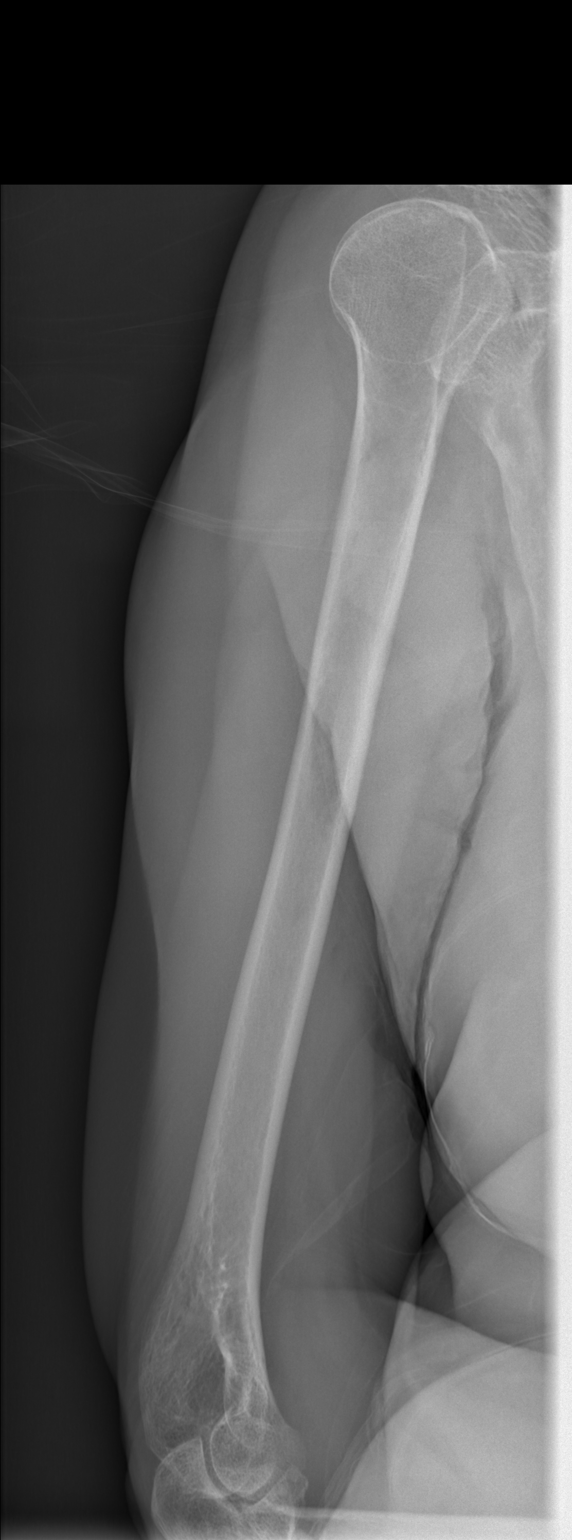

[t humerus ap right]
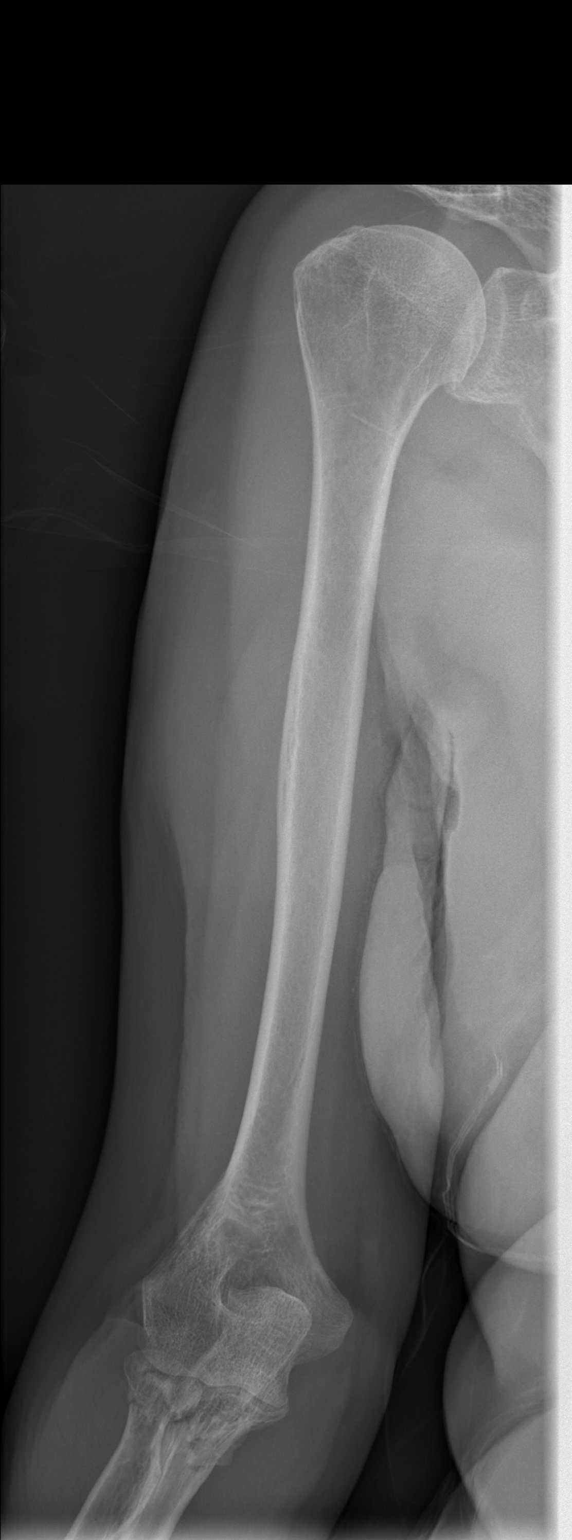

[2 of 2 positions shown; findings below may reference images not displayed]

FINDINGS: There is no evidence of fracture or dislocation of the right
humerus. There are comminuted displaced fractures of the proximal
ulna and radius.
IMPRESSION: No fracture of the humerus. Fracture of the proximal ulna and
radius.

## 2016-03-05 IMAGING — CR DG FOREARM 2V*R*
2 series · 2 of 2 positions shown · non-contrast
Comparison: None.

CLINICAL DATA: Fall

EXAM:
RIGHT FOREARM - 2 VIEW

[x forearm lat right]
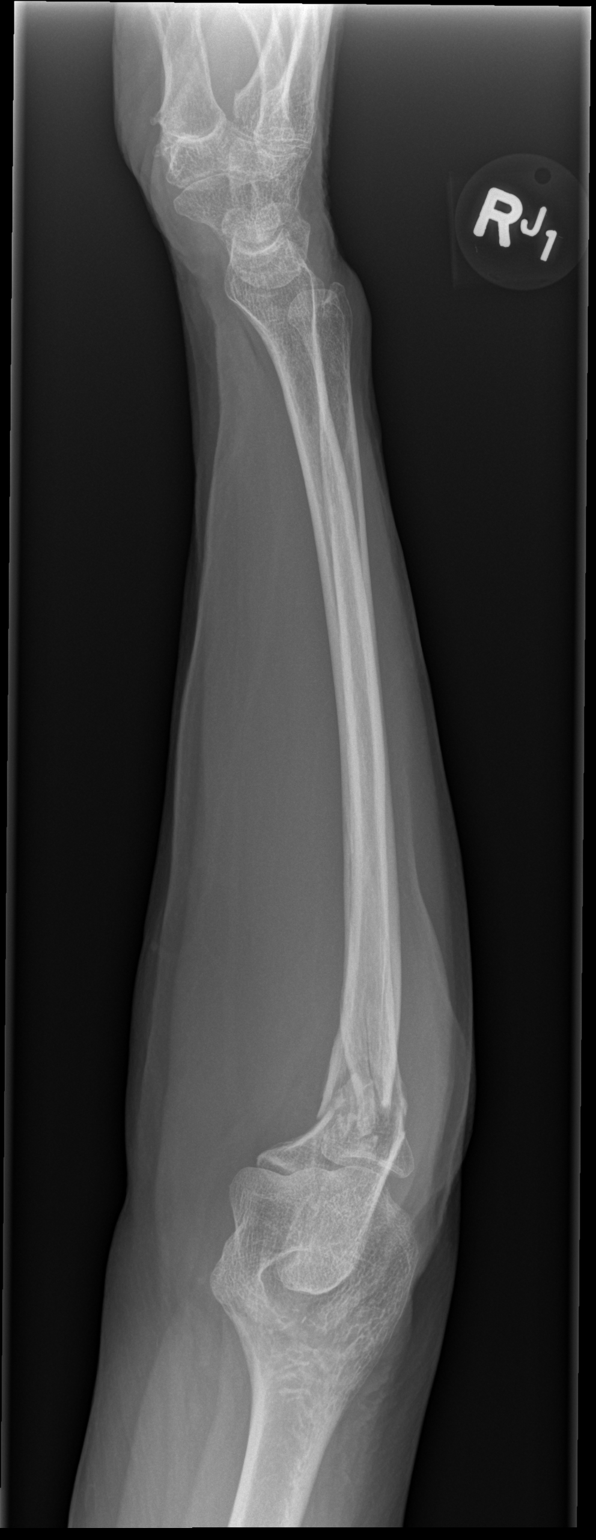

[x forearm ap right]
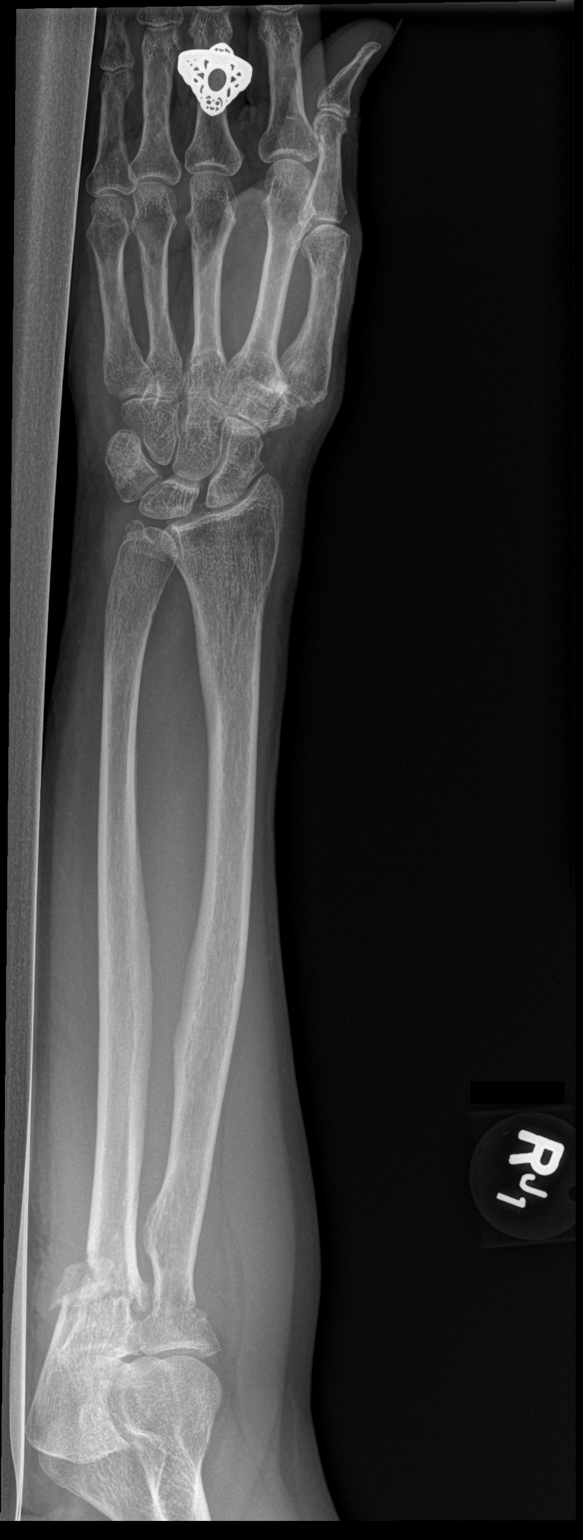

[2 of 2 positions shown; findings below may reference images not displayed]

FINDINGS: There is a comminuted fracture of the proximal ulnar diaphysis with
mild displacement. There is a nondisplaced fracture of the proximal
radial neck. There is no other fracture. There is no dislocation.
Mild relative widening of the scapholunate joint as can be seen with
a scapholunate ligament tear. There is soft tissue swelling
overlying the proximal dorsal right forearm.
IMPRESSION: 1. Comminuted fracture of the proximal ulnar diaphysis with mild
displacement.
2. Nondisplaced fracture of the proximal radial neck.

## 2016-07-26 ENCOUNTER — Ambulatory Visit (INDEPENDENT_AMBULATORY_CARE_PROVIDER_SITE_OTHER): Payer: Medicare Other | Admitting: Adult Health

## 2016-07-26 VITALS — BP 137/92 | HR 68 | Ht 62.5 in | Wt 109.8 lb

## 2016-07-26 DIAGNOSIS — F0280 Dementia in other diseases classified elsewhere without behavioral disturbance: Secondary | ICD-10-CM

## 2016-07-26 DIAGNOSIS — G300 Alzheimer's disease with early onset: Secondary | ICD-10-CM

## 2016-07-26 NOTE — Progress Notes (Signed)
I have read the note, and I agree with the clinical assessment and plan.  Jahnae Mcadoo KEITH   

## 2016-07-26 NOTE — Patient Instructions (Addendum)
Memory score is stable Continue namenda and Aricept (Namzaric) If your symptoms worsen or you develop new symptoms please let us know.

## 2016-07-26 NOTE — Progress Notes (Signed)
PATIENT: Debbie Sims DOB: [birth year]  REASON FOR VISIT: follow up- memory disturbance HISTORY FROM: patient- sister  HISTORY OF PRESENT ILLNESS: Debbie Sims is a 71 year old female with a history of progressive memory disturbance. She returns today for follow-up. She is currently on Aricept and Namenda. Reports that she is tolerating this medication well. She is able to complete all ADLs independently. She does not operate a motor vehicle. Denies any trouble sleeping. Sister reports that she has lost weight since the last visit. She states that she eats very little. Patient reports that she walks a lot now that she doesn't have a car and that is the reason for her weight loss.. Denies any changes with mood or behavior. Denies hallucinations. She reports that she occasionally will walk to the [employer]. Denies any trouble getting lost. In private her sister reports that sometimes she will wear the same clothes for 3 or 4 days. There is been other times that she has not washed her hair. Sr. denies any safety concerns. She returns today for an evaluation.  HISTORY 01/24/16: Debbie Sims is a 71 year old right-handed white female with a progressive memory disturbance. The patient has been on a combination of Namenda and Aricept, she is tolerating the medication well. She no longer operates a motor vehicle. She can walk to the grocery store and to a restaurant if she needs to. The patient actually does very little cooking. She has not given up any activities of daily living other than driving a car since last seen. She returns to this office for an evaluation.  REVIEW OF SYSTEMS: Out of a complete 14 system review of symptoms, the patient complains only of the following symptoms, and all other reviewed systems are negative.  Memory loss, dizziness, confusion, decreased concentration, depression, hallucinations  ALLERGIES: Allergies  Allergen Reactions  . Amoxicillin Other (See Comments)     . Trazodone Other (See Comments)  . Erythromycin Rash    HOME MEDICATIONS: Outpatient Medications Prior to Visit  Medication Sig Dispense Refill  . Cholecalciferol (VITAMIN D PO) Take 1 tablet by mouth daily.     Marland Kitchen HYDROcodone-acetaminophen (NORCO/VICODIN) 5-325 MG tablet Take 1 tablet by mouth every 6 (six) hours as needed for severe pain. 15 tablet 0  . Melatonin 5 MG TABS 1 tablet at bedtime as needed with food    . Memantine HCl-Donepezil HCl (NAMZARIC) 28-10 MG CP24 Take 1 capsule by mouth daily. 30 capsule 5  . quinapril (ACCUPRIL) 40 MG tablet Take 40 mg by mouth daily.    . sertraline (ZOLOFT) 100 MG tablet Take 100 mg by mouth daily.    . vitamin C (ASCORBIC ACID) 500 MG tablet Take 1 tablet (500 mg total) by mouth daily. 50 tablet 0   No facility-administered medications prior to visit.     PAST MEDICAL HISTORY: Past Medical History:  Diagnosis Date  . Alzheimer's disease 07/21/2015   Moderate severity  . Complication of anesthesia    slow to wake up  . Depression   . Hypertension   . Memory difficulty 07/20/2015  . Vitamin B12 deficiency     PAST SURGICAL HISTORY: Past Surgical History:  Procedure Laterality Date  . APPENDECTOMY    . ORIF ELBOW FRACTURE Right 11/27/2013   Procedure: OPEN REDUCTION INTERNAL FIXATION (ORIF) ELBOW/OLECRANON FRACTURE and Radial head replacement;  Surgeon: Sharma Covert, MD;  Location: [facility] OR;  Service: Orthopedics;  Laterality: Right;    FAMILY HISTORY: Family History  Problem Relation Age  of Onset  . Prostate cancer Father   . Heart disease Father   . Kidney disease Father   . Dementia Father   . Arthritis Mother   . Stroke Mother   . Dementia Maternal Aunt   . Dementia Maternal Uncle     SOCIAL HISTORY: Social History   Social History  . Marital status: Single    Spouse name: N/A  . Number of children: 1  . Years of education: 12+   Occupational History  . Retired    Social History Main Topics  . Smoking  status: Never Smoker  . Smokeless tobacco: Never Used  . Alcohol use 1.2 oz/week    2 Standard drinks or equivalent per week  . Drug use: No  . Sexual activity: Not on file   Other Topics Concern  . Not on file   Social History Narrative   Patient lives at home alone    Patient is retired.   Patient has a Chief Operating Officer.    Patient has 1 child.    Patient is single       1 cup of coffee daily   Right handed      PHYSICAL EXAM  Vitals:   07/26/16 1417  BP: (!) 137/92  Pulse: 68  Weight: 109 lb 12.8 oz (49.8 kg)  Height: 5' 2.5" (1.588 m)   Body mass index is 19.76 kg/m.  MMSE - Mini Mental State Exam 07/26/2016 01/24/2016 07/20/2015  Orientation to time 0 0 0  Orientation to Place 2 4 4   Registration 3 3 3   Attention/ Calculation 0 1 0  Recall 0 0 2  Language- name 2 objects 2 2 2   Language- repeat 1 1 1   Language- follow 3 step command 3 1 3   Language- read & follow direction 1 1 1   Write a sentence 1 1 1   Copy design 0 0 0  Total score 13 14 17      Generalized: Well developed, in no acute distress   Neurological examination  Mentation: Alert. Follows all commands speech and language fluent Cranial nerve II-XII: Pupils were equal round reactive to light. Extraocular movements were full, visual field were full on confrontational test. Facial sensation and strength were normal. Uvula tongue midline. Head turning and shoulder shrug  were normal and symmetric. Motor: The motor testing reveals 5 over 5 strength of all 4 extremities. Good symmetric motor tone is noted throughout.  Sensory: Sensory testing is intact to soft touch on all 4 extremities. No evidence of extinction is noted.  Coordination: Cerebellar testing reveals good finger-nose-finger and heel-to-shin bilaterally.  Gait and station: Gait is normal. Tandem gait is normal. Romberg is negative. No drift is seen.  Reflexes: Deep tendon reflexes are symmetric and normal bilaterally.   DIAGNOSTIC DATA (LABS,  IMAGING, TESTING) - I reviewed patient records, labs, notes, testing and imaging myself where available.       ASSESSMENT AND PLAN 71 y.o. year old female  has a past medical history of Alzheimer's disease (07/21/2015); Complication of anesthesia; Depression; Hypertension; Memory difficulty (07/20/2015); and Vitamin B12 deficiency. here with:  1. Alzheimer's dementia  The patient's memory score has remained the same. She will continue on Aricept and Namenda. In the future the patient may require more supervision in the home. This will need to be monitored over time. Advised that her symptoms worsen or she develops new symptoms she should let us know. Follow-up in 6 months or sooner if needed.  I spent 15 minutes  with the patient 50% of this time was spent discussing the progression of Alzheimer's disease.    Butch Penny, MSN, NP-C 07/26/2016, 2:28 PM [facility] [address], [address] [place], [place] [address] [phone]

## 2016-07-29 ENCOUNTER — Other Ambulatory Visit: Payer: Self-pay | Admitting: Neurology

## 2017-01-23 ENCOUNTER — Ambulatory Visit: Payer: Medicare Other | Admitting: Adult Health

## 2017-02-10 ENCOUNTER — Other Ambulatory Visit: Payer: Self-pay | Admitting: Neurology

## 2017-02-12 ENCOUNTER — Encounter: Payer: Self-pay | Admitting: Neurology

## 2017-02-12 ENCOUNTER — Ambulatory Visit (INDEPENDENT_AMBULATORY_CARE_PROVIDER_SITE_OTHER): Payer: Medicare Other | Admitting: Neurology

## 2017-02-12 VITALS — BP 163/115 | HR 64 | Ht 62.5 in | Wt 111.5 lb

## 2017-02-12 DIAGNOSIS — G300 Alzheimer's disease with early onset: Secondary | ICD-10-CM

## 2017-02-12 DIAGNOSIS — F0280 Dementia in other diseases classified elsewhere without behavioral disturbance: Secondary | ICD-10-CM

## 2017-02-12 NOTE — Progress Notes (Signed)
Reason for visit: Alzheimer's disease  Debbie Sims is an 71 y.o. female  History of present illness:  Debbie Sims is a 71 year old right-handed white female with a history of a progressive memory disturbance associated with Alzheimer's disease. The patient is on Namzaric and she is tolerating the medication well. The patient is still living in an apartment, her family will help her with medications and finances. The patient does not operate a motor vehicle. Her family buys her groceries, she may heat things up in the microwave at times. The family is looking into getting her into an assisted living facility. The patient is not having significant safety issues so far, the family is concerned about this feature. The patient returns for an evaluation.  Past Medical History:  Diagnosis Date  . Alzheimer's disease 07/21/2015   Moderate severity  . Complication of anesthesia    slow to wake up  . Depression   . Hypertension   . Memory difficulty 07/20/2015  . Vitamin B12 deficiency     Past Surgical History:  Procedure Laterality Date  . APPENDECTOMY    . ORIF ELBOW FRACTURE Right 11/27/2013   Procedure: OPEN REDUCTION INTERNAL FIXATION (ORIF) ELBOW/OLECRANON FRACTURE and Radial head replacement;  Surgeon: Sharma Covert, MD;  Location: [facility] OR;  Service: Orthopedics;  Laterality: Right;    Family History  Problem Relation Age of Onset  . Prostate cancer Father   . Heart disease Father   . Kidney disease Father   . Dementia Father   . Arthritis Mother   . Stroke Mother   . Dementia Maternal Aunt   . Dementia Maternal Uncle     Social history:  reports that she has never smoked. She has never used smokeless tobacco. She reports that she drinks about 1.2 oz of alcohol per week . She reports that she does not use drugs.    Allergies  Allergen Reactions  . Amoxicillin Other (See Comments)    Has patient had a PCN reaction causing immediate rash, facial/tongue/throat swelling,  SOB or lightheadedness with hypotension: No Has patient had a PCN reaction causing severe rash involving mucus membranes or skin necrosis: No Has patient had a PCN reaction that required hospitalization No Has patient had a PCN reaction occurring within the last 10 years: No If all of the above answers are "NO", then may proceed with Cephalosporin use.  . Trazodone Other (See Comments)  . Erythromycin Rash    Medications:  Prior to Admission medications   Medication Sig Start Date End Date Taking? Authorizing Provider  Cholecalciferol (VITAMIN D PO) Take 1 tablet by mouth daily.    Yes [provider]  Melatonin 5 MG TABS 1 tablet at bedtime as needed with food   Yes [provider]  NAMZARIC 28-10 MG CP24 TAKE ONE CAPSULE BY MOUTH DAILY 02/12/17  Yes York Spaniel, MD  quinapril (ACCUPRIL) 40 MG tablet Take 40 mg by mouth daily.   Yes [provider]  sertraline (ZOLOFT) 100 MG tablet Take 100 mg by mouth daily.   Yes [provider]  vitamin C (ASCORBIC ACID) 500 MG tablet Take 1 tablet (500 mg total) by mouth daily. 11/28/13  Yes Bradly Bienenstock, MD    ROS:  Out of a complete 14 system review of symptoms, the patient complains only of the following symptoms, and all other reviewed systems are negative.  Memory loss Confusion, depression, hallucinations  Blood pressure (!) 163/115, pulse 64, height 5' 2.5" (1.588  m), weight 111 lb 8 oz (50.6 kg).  Physical Exam  General: The patient is alert and cooperative at the time of the examination.  Skin: No significant peripheral edema is noted.   Neurologic Exam  Mental status: The patient is alert and oriented x 1 at the time of the examination (not oriented to place or date). The Mini-Mental Status Examination done today shows a total score of 12/30.   Cranial nerves: Facial symmetry is present. Speech is normal, no aphasia or dysarthria is noted. Extraocular movements are full. Visual fields  are full.  Motor: The patient has good strength in all 4 extremities.  Sensory examination: Soft touch sensation is symmetric on the face, arms, and legs.  Coordination: The patient has good finger-nose-finger and heel-to-shin bilaterally. Apraxia with the use of the extremities is noted.  Gait and station: The patient has a normal gait. Tandem gait is normal. Romberg is negative. No drift is seen.  Reflexes: Deep tendon reflexes are symmetric.   Assessment/Plan:  1. Alzheimer's disease  The patient will continue Namzaric at this time. A prescription was sent in today. The patient will follow-up in 8 months. The family is looking into transferring the patient into an assisted living situation.  Marlan Palau MD 02/12/2017 4:08 PM  [facility] [address] [place], [place] [address]  Phone [phone] Fax [phone]

## 2017-07-13 ENCOUNTER — Encounter: Payer: Self-pay | Admitting: Neurology

## 2017-07-13 ENCOUNTER — Ambulatory Visit: Payer: Medicare Other | Admitting: Neurology

## 2017-07-13 ENCOUNTER — Encounter (INDEPENDENT_AMBULATORY_CARE_PROVIDER_SITE_OTHER): Payer: Self-pay

## 2017-07-13 VITALS — BP 155/101 | HR 60 | Wt 114.0 lb

## 2017-07-13 DIAGNOSIS — F0280 Dementia in other diseases classified elsewhere without behavioral disturbance: Secondary | ICD-10-CM

## 2017-07-13 DIAGNOSIS — G300 Alzheimer's disease with early onset: Secondary | ICD-10-CM | POA: Diagnosis not present

## 2017-07-13 NOTE — Progress Notes (Signed)
Reason for visit: Alzheimer's disease  Debbie Sims is an 72 y.o. female  History of present illness:  Debbie Sims is a 72 year old right-handed white female with a history of a progressive memory disturbance.  The patient comes in with her family today.  There have been some concerns of increased cognitive decline.  The patient currently lives independently in an apartment.  She has been giving out her credit card numbers to callers, she is having difficulty figure out how to use a telephone and the remote to the TV.  She walks in order to get supplies.  She does not drive a car.  She forgets to take her medications even after the family calls and reminds her to take the pills.  She is on Namzeric currently.  The patient has been resisting any suggestions by the family that she move into an independent or assisted living retirement community.  The patient is adamant that she wants to stay in her own apartment.  She indicates that she sleeps fairly well at night.  If you comes to this office for an evaluation.  Past Medical History:  Diagnosis Date  . Alzheimer's disease 07/21/2015   Moderate severity  . Complication of anesthesia    slow to wake up  . Depression   . Hypertension   . Memory difficulty 07/20/2015  . Vitamin B12 deficiency     Past Surgical History:  Procedure Laterality Date  . APPENDECTOMY    . ORIF ELBOW FRACTURE Right 11/27/2013   Procedure: OPEN REDUCTION INTERNAL FIXATION (ORIF) ELBOW/OLECRANON FRACTURE and Radial head replacement;  Surgeon: Sharma Covert, MD;  Location: [facility] OR;  Service: Orthopedics;  Laterality: Right;    Family History  Problem Relation Age of Onset  . Prostate cancer Father   . Heart disease Father   . Kidney disease Father   . Dementia Father   . Arthritis Mother   . Stroke Mother   . Dementia Maternal Aunt   . Dementia Maternal Uncle     Social history:  reports that  has never smoked. she has never used smokeless tobacco. She  reports that she drinks about 1.2 oz of alcohol per week. She reports that she does not use drugs.    Allergies  Allergen Reactions  . Amoxicillin Other (See Comments)    Has patient had a PCN reaction causing immediate rash, facial/tongue/throat swelling, SOB or lightheadedness with hypotension: No Has patient had a PCN reaction causing severe rash involving mucus membranes or skin necrosis: No Has patient had a PCN reaction that required hospitalization No Has patient had a PCN reaction occurring within the last 10 years: No If all of the above answers are "NO", then may proceed with Cephalosporin use.  . Trazodone Other (See Comments)  . Erythromycin Rash    Medications:  Prior to Admission medications   Medication Sig Start Date End Date Taking? Authorizing Provider  Cholecalciferol (VITAMIN D PO) Take 1 tablet by mouth daily.    Yes [provider]  Melatonin 5 MG TABS 1 tablet at bedtime as needed with food   Yes [provider]  NAMZARIC 28-10 MG CP24 TAKE ONE CAPSULE BY MOUTH DAILY 02/12/17  Yes York Spaniel, MD  quinapril (ACCUPRIL) 40 MG tablet Take 40 mg by mouth daily.   Yes [provider]  sertraline (ZOLOFT) 100 MG tablet Take 100 mg by mouth daily.   Yes [provider]  vitamin C (ASCORBIC ACID) 500 MG tablet  Take 1 tablet (500 mg total) by mouth daily. 11/28/13  Yes Bradly Bienenstock, MD    ROS:  Out of a complete 14 system review of symptoms, the patient complains only of the following symptoms, and all other reviewed systems are negative.  Memory loss Confusion, decreased concentration, hallucinations  Blood pressure (!) 155/101, pulse 60, weight 114 lb (51.7 kg).  Physical Exam  General: The patient is alert and cooperative at the time of the examination.   Neurologic Exam  Mental status: The patient is alert and oriented x 2 at the time of the examination (not oriented to date). The Mini-Mental status examination done  today shows a total score of 17/30.  The patient is able to name 4 four legged animals in 1 minute..   Cranial nerves: Facial symmetry is present. Speech is normal, no aphasia or dysarthria is noted.  Gait and station: The patient has a normal gait.   A full examination was not done, the patient walked out of the room.  Assessment/Plan:  1.  Progressive memory disorder, Alzheimer's disease  The patient is having increasing problems managing on her own.  The family has looked at several assisted living in independent living facilities.  The patient is not willing to consider this move at this time.  The patient is having increasing problems with doing things day-to-day.  I have recommended that they take the patient to visit the facility to acclimate her to the concept.  The patient will remain on Namzeric.  Greater than 50% of the time spent with the patient was discussing issues with the family.  About 25 minutes was spent during this visit.  Marlan Palau MD 07/13/2017 10:27 AM  [facility] [address] [place], [place] [address]  Phone [phone] Fax [phone]

## 2017-09-24 ENCOUNTER — Encounter: Payer: Self-pay | Admitting: Neurology

## 2017-09-24 ENCOUNTER — Telehealth: Payer: Self-pay | Admitting: Neurology

## 2017-09-24 NOTE — Telephone Encounter (Signed)
Called, LVM for son letting him know I got his message. I will address this w/ Dr. Anne Hahn once he returns to the office. Advised him to call back if he has further questions/concerns.

## 2017-09-24 NOTE — Telephone Encounter (Signed)
Pts son calling requesting a note stating why she is needing to break her lease to move to assisted living. Due to pt having problems with day-to -day activities and problems getting around on her own. Thayer Ohm is aware Dr. Anne Hahn is not in the office today  Please call to advise.

## 2017-09-24 NOTE — Telephone Encounter (Signed)
I called the son.  The patient will be going into [facility] tomorrow.  I will dictate a letter indicating that she has Alzheimer's disease and cannot safely manage on her own in the apartment.  I will fax this to [phone].

## 2017-10-15 ENCOUNTER — Ambulatory Visit: Payer: Medicare Other | Admitting: Adult Health

## 2018-01-14 ENCOUNTER — Ambulatory Visit: Payer: Medicare Other | Admitting: Adult Health

## 2018-03-03 ENCOUNTER — Other Ambulatory Visit: Payer: Self-pay | Admitting: Neurology

## 2018-03-04 ENCOUNTER — Telehealth: Payer: Self-pay | Admitting: *Deleted

## 2018-03-04 NOTE — Telephone Encounter (Signed)
ERROR/fim 

## 2018-06-18 ENCOUNTER — Ambulatory Visit: Payer: Medicare Other | Admitting: Adult Health

## 2018-06-18 ENCOUNTER — Telehealth: Payer: Self-pay | Admitting: *Deleted

## 2018-06-18 NOTE — Telephone Encounter (Signed)
Patient was no show for follow up with NP today.  

## 2018-06-19 ENCOUNTER — Encounter: Payer: Self-pay | Admitting: Adult Health

## 2019-02-27 DIAGNOSIS — I513 Intracardiac thrombosis, not elsewhere classified: Secondary | ICD-10-CM

## 2019-02-27 DIAGNOSIS — I219 Acute myocardial infarction, unspecified: Secondary | ICD-10-CM

## 2019-02-27 DIAGNOSIS — I5041 Acute combined systolic (congestive) and diastolic (congestive) heart failure: Secondary | ICD-10-CM

## 2019-02-27 HISTORY — DX: Intracardiac thrombosis, not elsewhere classified: I51.3

## 2019-02-27 HISTORY — DX: Acute combined systolic (congestive) and diastolic (congestive) heart failure: I50.41

## 2019-02-27 HISTORY — DX: Acute myocardial infarction, unspecified: I21.9

## 2019-03-06 ENCOUNTER — Inpatient Hospital Stay (HOSPITAL_COMMUNITY)
Admission: EM | Admit: 2019-03-06 | Discharge: 2019-03-08 | DRG: 280 | Disposition: A | Discharge status: Home / Self Care | Payer: Medicare Other | Attending: Cardiovascular Disease | Admitting: Cardiovascular Disease

## 2019-03-06 ENCOUNTER — Emergency Department (HOSPITAL_COMMUNITY): Payer: Medicare Other

## 2019-03-06 DIAGNOSIS — I5041 Acute combined systolic (congestive) and diastolic (congestive) heart failure: Secondary | ICD-10-CM | POA: Diagnosis present

## 2019-03-06 DIAGNOSIS — Z7901 Long term (current) use of anticoagulants: Secondary | ICD-10-CM

## 2019-03-06 DIAGNOSIS — Z8249 Family history of ischemic heart disease and other diseases of the circulatory system: Secondary | ICD-10-CM

## 2019-03-06 DIAGNOSIS — Z823 Family history of stroke: Secondary | ICD-10-CM | POA: Diagnosis not present

## 2019-03-06 DIAGNOSIS — Z66 Do not resuscitate: Secondary | ICD-10-CM | POA: Diagnosis present

## 2019-03-06 DIAGNOSIS — Z79899 Other long term (current) drug therapy: Secondary | ICD-10-CM | POA: Diagnosis not present

## 2019-03-06 DIAGNOSIS — Z841 Family history of disorders of kidney and ureter: Secondary | ICD-10-CM

## 2019-03-06 DIAGNOSIS — Z20828 Contact with and (suspected) exposure to other viral communicable diseases: Secondary | ICD-10-CM | POA: Diagnosis present

## 2019-03-06 DIAGNOSIS — G309 Alzheimer's disease, unspecified: Secondary | ICD-10-CM | POA: Diagnosis not present

## 2019-03-06 DIAGNOSIS — I10 Essential (primary) hypertension: Secondary | ICD-10-CM | POA: Diagnosis not present

## 2019-03-06 DIAGNOSIS — Z818 Family history of other mental and behavioral disorders: Secondary | ICD-10-CM

## 2019-03-06 DIAGNOSIS — Z8261 Family history of arthritis: Secondary | ICD-10-CM

## 2019-03-06 DIAGNOSIS — Z8042 Family history of malignant neoplasm of prostate: Secondary | ICD-10-CM | POA: Diagnosis not present

## 2019-03-06 DIAGNOSIS — R413 Other amnesia: Secondary | ICD-10-CM | POA: Diagnosis present

## 2019-03-06 DIAGNOSIS — F0280 Dementia in other diseases classified elsewhere without behavioral disturbance: Secondary | ICD-10-CM | POA: Diagnosis present

## 2019-03-06 DIAGNOSIS — I2109 ST elevation (STEMI) myocardial infarction involving other coronary artery of anterior wall: Principal | ICD-10-CM | POA: Diagnosis present

## 2019-03-06 DIAGNOSIS — I214 Non-ST elevation (NSTEMI) myocardial infarction: Secondary | ICD-10-CM | POA: Diagnosis not present

## 2019-03-06 DIAGNOSIS — R079 Chest pain, unspecified: Secondary | ICD-10-CM

## 2019-03-06 DIAGNOSIS — I513 Intracardiac thrombosis, not elsewhere classified: Secondary | ICD-10-CM | POA: Diagnosis not present

## 2019-03-06 DIAGNOSIS — E785 Hyperlipidemia, unspecified: Secondary | ICD-10-CM | POA: Diagnosis not present

## 2019-03-06 DIAGNOSIS — Z881 Allergy status to other antibiotic agents status: Secondary | ICD-10-CM

## 2019-03-06 DIAGNOSIS — Z888 Allergy status to other drugs, medicaments and biological substances status: Secondary | ICD-10-CM | POA: Diagnosis not present

## 2019-03-06 DIAGNOSIS — I110 Hypertensive heart disease with heart failure: Secondary | ICD-10-CM | POA: Diagnosis present

## 2019-03-06 LAB — CBC
HCT: 45.9 % (ref 36.0–46.0)
Hemoglobin: 15.3 g/dL — ABNORMAL HIGH (ref 12.0–15.0)
MCH: 32.8 pg (ref 26.0–34.0)
MCHC: 33.3 g/dL (ref 30.0–36.0)
MCV: 98.3 fL (ref 80.0–100.0)
Platelets: 203 10*3/uL (ref 150–400)
RBC: 4.67 MIL/uL (ref 3.87–5.11)
RDW: 13.4 % (ref 11.5–15.5)
WBC: 7.5 10*3/uL (ref 4.0–10.5)
nRBC: 0 % (ref 0.0–0.2)

## 2019-03-06 LAB — BASIC METABOLIC PANEL
Anion gap: 10 (ref 5–15)
BUN: 16 mg/dL (ref 8–23)
CO2: 21 mmol/L — ABNORMAL LOW (ref 22–32)
Calcium: 8.6 mg/dL — ABNORMAL LOW (ref 8.9–10.3)
Chloride: 108 mmol/L (ref 98–111)
Creatinine, Ser: 1.04 mg/dL — ABNORMAL HIGH (ref 0.44–1.00)
GFR calc Af Amer: >60 mL/min (ref >60)
GFR calc non Af Amer: 54 mL/min — ABNORMAL LOW (ref >60)
Glucose, Bld: 115 mg/dL — ABNORMAL HIGH (ref 70–99)
Potassium: 3.9 mmol/L (ref 3.5–5.1)
Sodium: 139 mmol/L (ref 135–145)

## 2019-03-06 LAB — HEPARIN LEVEL (UNFRACTIONATED): Heparin Unfractionated: 0.3 IU/mL (ref 0.30–0.70)

## 2019-03-06 LAB — TROPONIN I (HIGH SENSITIVITY)
Troponin I (High Sensitivity): 1775 ng/L (ref <18)
Troponin I (High Sensitivity): 2045 ng/L (ref <18)
Troponin I (High Sensitivity): 4378 ng/L (ref <18)

## 2019-03-06 LAB — ECHOCARDIOGRAM COMPLETE: Weight: 1920 oz

## 2019-03-06 LAB — SARS CORONAVIRUS 2 BY RT PCR (HOSPITAL ORDER, PERFORMED IN ~~LOC~~ HOSPITAL LAB): SARS Coronavirus 2: NEGATIVE

## 2019-03-06 MED ORDER — HEPARIN (PORCINE) 25000 UT/250ML-% IV SOLN
750.0000 [IU]/h | INTRAVENOUS | Status: DC
  Administered 2019-03-06: 650 [IU]/h via INTRAVENOUS
  Filled 2019-03-06: qty 250

## 2019-03-06 MED ORDER — METOPROLOL TARTRATE 12.5 MG HALF TABLET
12.5000 mg | ORAL_TABLET | Freq: Two times a day (BID) | ORAL | Status: DC
  Administered 2019-03-06 – 2019-03-08 (×4): 12.5 mg via ORAL
  Filled 2019-03-06 (×4): qty 1

## 2019-03-06 MED ORDER — ISOSORBIDE MONONITRATE ER 30 MG PO TB24
30.0000 mg | ORAL_TABLET | Freq: Every day | ORAL | Status: DC
  Administered 2019-03-06 – 2019-03-08 (×3): 30 mg via ORAL
  Filled 2019-03-06 (×3): qty 1

## 2019-03-06 MED ORDER — ASPIRIN EC 81 MG PO TBEC: 81.0000 mg | DELAYED_RELEASE_TABLET | Freq: Every day | ORAL | Status: DC

## 2019-03-06 MED ORDER — WARFARIN - PHARMACIST DOSING INPATIENT: Freq: Every day | Status: DC

## 2019-03-06 MED ORDER — HEPARIN BOLUS VIA INFUSION
3000.0000 [IU] | Freq: Once | INTRAVENOUS | Status: AC
  Administered 2019-03-06: 13:00:00 3000 [IU] via INTRAVENOUS
  Filled 2019-03-06: qty 3000

## 2019-03-06 MED ORDER — ATORVASTATIN CALCIUM 40 MG PO TABS: 40.0000 mg | ORAL_TABLET | Freq: Every day | ORAL | Status: DC

## 2019-03-06 MED ORDER — ONDANSETRON HCL 4 MG/2ML IJ SOLN: 4.0000 mg | Freq: Four times a day (QID) | INTRAMUSCULAR | Status: DC | PRN

## 2019-03-06 MED ORDER — ASPIRIN 81 MG PO CHEW: 324.0000 mg | CHEWABLE_TABLET | Freq: Once | ORAL | Status: DC

## 2019-03-06 MED ORDER — ASPIRIN 81 MG PO CHEW: 324.0000 mg | CHEWABLE_TABLET | ORAL | Status: DC

## 2019-03-06 MED ORDER — ATORVASTATIN CALCIUM 80 MG PO TABS
80.0000 mg | ORAL_TABLET | Freq: Every day | ORAL | Status: DC
  Administered 2019-03-07: 80 mg via ORAL
  Filled 2019-03-06: qty 1

## 2019-03-06 MED ORDER — ACETAMINOPHEN 325 MG PO TABS: 650.0000 mg | ORAL_TABLET | ORAL | Status: DC | PRN

## 2019-03-06 MED ORDER — QUINAPRIL HCL 10 MG PO TABS
40.0000 mg | ORAL_TABLET | Freq: Every day | ORAL | Status: DC
  Administered 2019-03-07 – 2019-03-08 (×2): 40 mg via ORAL
  Filled 2019-03-06 (×2): qty 4

## 2019-03-06 MED ORDER — ASPIRIN 300 MG RE SUPP: 300.0000 mg | RECTAL | Status: DC

## 2019-03-06 MED ORDER — ASPIRIN EC 81 MG PO TBEC
81.0000 mg | DELAYED_RELEASE_TABLET | Freq: Every day | ORAL | Status: DC
  Administered 2019-03-07 – 2019-03-08 (×2): 81 mg via ORAL
  Filled 2019-03-06 (×2): qty 1

## 2019-03-06 MED ORDER — TICAGRELOR 90 MG PO TABS
90.0000 mg | ORAL_TABLET | Freq: Two times a day (BID) | ORAL | Status: DC
  Filled 2019-03-06: qty 1

## 2019-03-06 MED ORDER — NITROGLYCERIN 0.4 MG SL SUBL: 0.4000 mg | SUBLINGUAL_TABLET | SUBLINGUAL | Status: DC | PRN

## 2019-03-06 MED ORDER — CLOPIDOGREL BISULFATE 75 MG PO TABS: 75.0000 mg | ORAL_TABLET | Freq: Every day | ORAL | Status: DC

## 2019-03-06 MED ORDER — WARFARIN SODIUM 5 MG PO TABS
5.0000 mg | ORAL_TABLET | Freq: Once | ORAL | Status: DC
  Filled 2019-03-06: qty 1

## 2019-03-06 MED ORDER — CLOPIDOGREL BISULFATE 75 MG PO TABS: 300.0000 mg | ORAL_TABLET | Freq: Once | ORAL | Status: DC

## 2019-03-06 NOTE — Progress Notes (Addendum)
ANTICOAGULATION CONSULT NOTE - Initial Consult  Pharmacy Consult for Heparin dosing Indication: LV thrombus  Allergies  Allergen Reactions  . Amoxicillin Other (See Comments)    Has patient had a PCN reaction causing immediate rash, facial/tongue/throat swelling, SOB or lightheadedness with hypotension: No Has patient had a PCN reaction causing severe rash involving mucus membranes or skin necrosis: No Has patient had a PCN reaction that required hospitalization No Has patient had a PCN reaction occurring within the last 10 years: No If all of the above answers are "NO", then may proceed with Cephalosporin use.  . Trazodone Other (See Comments)  . Erythromycin Rash    Patient Measurements: Weight: 120 lb (54.4 kg) Heparin Dosing Weight: 54.4kg  Vital Signs: Temp: 98.5 F (36.9 C) (10/08 1034) Temp Source: Oral (10/08 1034) BP: 99/19 (10/08 1800) Pulse Rate: 73 (10/08 1552)  Labs: Recent Labs    03/06/19 1047 03/06/19 1242 03/06/19 1917  HGB 15.3*  --   --   HCT 45.9  --   --   PLT 203  --   --   HEPARINUNFRC  --   --  0.30  CREATININE 1.04*  --   --   TROPONINIHS 1,775* 2,045*  --     CrCl cannot be calculated (Unknown ideal weight.).   Medical History: Past Medical History:  Diagnosis Date  . Alzheimer's disease 07/21/2015   Moderate severity  . Complication of anesthesia    slow to wake up  . Depression   . Hypertension   . Memory difficulty 07/20/2015  . Vitamin B12 deficiency     Medications:  Infusions:  . heparin 650 Units/hr (03/06/19 1302)    Assessment: 89 yof presenting with chest pain to the ED. Was found to have an elevated troponin. She was initially worked up for a NSTEMI. Echo demonstrated LV thrombus. Pharmacy was consulted to dose heparin and warfarin for LV thrombus.  Heparin level at 1917 was therapeutic at 0.30 on heparin rate of 6.5 units/hr. Patient does not have a baseline INR, however expect the INR to be ~1 since she was not on  warfarin PTA. No signs/symptoms of bleeding per RN. CBC this morning was wnls  Goal of Therapy:  Heparin level 0.3-0.7 units/ml INR Goal 2-3  Plan:  - Continue heparin at 6.5 units/hr given that patient is on DAPT and starting warfarin. - Give warfarin 5 mg x 1 tonight - Will obtain a heparin level in 6 hours - Obtain daily PT-INR - Monitor patient's CBC and for signs and symptoms of bleeding.  -Daily Heparin level and CBC    Sherren Kerns, PharmD PGY1 Acute Care Pharmacy Resident 03/06/2019,7:53 PM

## 2019-03-06 NOTE — ED Notes (Signed)
Dr. Kathrynn Humble informed of patient's troponin 1,775. Verbal order for repeat EKG received.

## 2019-03-06 NOTE — Progress Notes (Addendum)
Echo report note, will discuss with MD.   Spoke to the patient's son who says patient likely has a Living Will, he is under the impression that the patient may be DNR, but will verify and let us know. Will change code once verified by son  Addendum: Confirmed with son that she is DNR  Also discussed with MD, cancelled brilinta. Plan for ASA, plavix and coumadin given LV thrombus seen on echo

## 2019-03-06 NOTE — ED Notes (Signed)
Patient transported to X-ray 

## 2019-03-06 NOTE — ED Notes (Signed)
  Patient unable to settle down.  Keeps getting up to use the bathroom every 5 mins.  Patient not redirectable and agitated.

## 2019-03-06 NOTE — Progress Notes (Signed)
  Echocardiogram 2D Echocardiogram has been performed.  Jennette Dubin 03/06/2019, 2:16 PM

## 2019-03-06 NOTE — ED Notes (Signed)
ECHO at bedside.

## 2019-03-06 NOTE — ED Notes (Signed)
  MD notified about patient activity status.  Attempted to redirect patient and help her go to sleep.

## 2019-03-06 NOTE — H&P (Signed)
Cardiology Admission History and Physical:   Patient ID: Debbie Sims Sims MRN: [id number]; DOB: 11-14-1945   Admission date: 03/06/2019  Primary Care Provider: Kathyrn Lass, MD Primary Cardiologist: new to [facility] Primary Electrophysiologist:  None   Chief Complaint:  Chest pain  Patient Profile:   Debbie Sims Sims is a 73 y.o. female with past medical history of hypertension and Alzheimer's disease presented with chest pain and ruled in for NSTEMI  History of Present Illness:   Debbie Sims Sims is a 73 year old female with past medical history of hypertension and Alzheimer's disease.  According to her son, she was diagnosed with Alzheimer's disease at least 5 years ago and her memory has been progressively worse.  She is currently residing in [facility].  Her medications are given to her on a daily basis.  She denies any prior cardiac issue and has never seen a cardiologist.  Both of her parents had heart disease later in life.  Her mother had bypass surgery.  She says she was in her usual state of health until this morning when she had sudden onset of chest pain that lasted around 30 minutes.  She is only able to give me much details due to pulmonary issue.  By the time she arrived in the [facility] emergency room, her chest pain has completely resolved.  Initial EKG showed minimal ST elevation in V2 and V3 with questionable artifact versus second-degree type I heart block.  Repeat EKG obtained 2 hours later demonstrated worsening ST elevation in V2 and V3.  She does not remember the current year, the location and up with the current president of [place].  Cardiology has been called for evaluation of NSTEMI   Heart Pathway Score:     Past Medical History:  Diagnosis Date  . Alzheimer's disease 07/21/2015   Moderate severity  . Complication of anesthesia    slow to wake up  . Depression   . Hypertension   . Memory difficulty 07/20/2015  . Vitamin B12 deficiency     Past  Surgical History:  Procedure Laterality Date  . APPENDECTOMY    . ORIF ELBOW FRACTURE Right 11/27/2013   Procedure: OPEN REDUCTION INTERNAL FIXATION (ORIF) ELBOW/OLECRANON FRACTURE and Radial head replacement;  Surgeon: [name], MD;  Location: [place];  Service: Orthopedics;  Laterality: Right;     Medications Prior to Admission: Prior to Admission medications   Medication Sig Start Date End Date Taking? Authorizing Provider  Cholecalciferol (VITAMIN D PO) Take 1 tablet by mouth daily.    Yes [provider]  Memantine HCl-Donepezil HCl (NAMZARIC) 28-10 MG CP24 Take 1 capsule by mouth daily. Please keep your pending appt. with [name], NP on 06/18/17, arrival time of 12:30pm. Patient taking differently: Take 1 capsule by mouth daily.  03/04/18  Yes Kathrynn Ducking, MD  quinapril (ACCUPRIL) 40 MG tablet Take 40 mg by mouth daily.   Yes [provider]  sertraline (ZOLOFT) 100 MG tablet Take 100 mg by mouth daily.   Yes [provider]  vitamin C (ASCORBIC ACID) 500 MG tablet Take 1 tablet (500 mg total) by mouth daily. 11/28/13   Iran Planas, MD     Allergies:    Allergies  Allergen Reactions  . Amoxicillin Other (See Comments)    Has patient had a PCN reaction causing immediate rash, facial/tongue/throat swelling, SOB or lightheadedness with hypotension: No Has patient had a PCN reaction causing severe rash involving mucus membranes or skin necrosis:  No Has patient had a PCN reaction that required hospitalization No Has patient had a PCN reaction occurring within the last 10 years: No If all of the above answers are "NO", then may proceed with Cephalosporin use.  . Trazodone Other (See Comments)  . Erythromycin Rash    Social History:   Social History   Socioeconomic History  . Marital status: Single    Spouse name: Not on file  . Number of children: 1  . Years of education: 12+  . Highest education level: Not on file  Occupational History  .  Occupation: Retired  Engineer, production  . Financial resource strain: Not on file  . Food insecurity    Worry: Not on file    Inability: Not on file  . Transportation needs    Medical: Not on file    Non-medical: Not on file  Tobacco Use  . Smoking status: Never Smoker  . Smokeless tobacco: Never Used  Substance and Sexual Activity  . Alcohol use: Yes    Alcohol/week: 2.0 standard drinks    Types: 2 Standard drinks or equivalent per week  . Drug use: No  . Sexual activity: Not on file  Lifestyle  . Physical activity    Days per week: Not on file    Minutes per session: Not on file  . Stress: Not on file  Relationships  . Social Musician on phone: Not on file    Gets together: Not on file    Attends religious service: Not on file    Active member of club or organization: Not on file    Attends meetings of clubs or organizations: Not on file    Relationship status: Not on file  . Intimate partner violence    Fear of current or ex partner: Not on file    Emotionally abused: Not on file    Physically abused: Not on file    Forced sexual activity: Not on file  Other Topics Concern  . Not on file  Social History Narrative   Patient lives at home alone    Patient is retired.   Patient has a Chief Operating Officer.    Patient has 1 child.    Patient is single       1 cup of coffee daily   Right handed    Family History:   The patient's family history includes Arthritis in her mother; Dementia in her father, maternal aunt, and maternal uncle; Heart disease in her father; Kidney disease in her father; Prostate cancer in her father; Stroke in her mother.    ROS:  Please see the history of present illness.  All other ROS reviewed and negative.     Physical Exam/Data:   Vitals:   03/06/19 1034 03/06/19 1045 03/06/19 1142  BP: (!) 144/106 (!) 140/103 (!) 135/95  Pulse: 67 68 72  Resp: 18 20 18   Temp: 98.5 F (36.9 C)    TempSrc: Oral    SpO2: 99% 99% 99%  Weight: 54.4 kg      No intake or output data in the 24 hours ending 03/06/19 1303 Last 3 Weights 03/06/2019 07/13/2017 02/12/2017  Weight (lbs) 120 lb 114 lb 111 lb 8 oz  Weight (kg) 54.432 kg 51.71 kg 50.576 kg     Body mass index is 21.6 kg/m.  General:  Well nourished, well developed, in no acute distress HEENT: normal Lymph: no adenopathy Neck: no JVD Endocrine:  No thryomegaly Vascular: No carotid bruits;  FA pulses 2+ bilaterally without bruits  Cardiac:  normal S1, S2; RRR; no murmur  Lungs:  clear to auscultation bilaterally, no wheezing, rhonchi or rales  Abd: soft, nontender, no hepatomegaly  Ext: no edema Musculoskeletal:  No deformities, BUE and BLE strength normal and equal Skin: warm and dry  Neuro:  CNs 2-12 intact, no focal abnormalities noted Psych:  Flat affect    EKG:  The ECG that was done in the ED and was personally reviewed and demonstrates normal sinus rhythm with minimal ST elevation in V2 and V3 with T wave inversion.  Relevant CV Studies:  pending  Laboratory Data:  High Sensitivity Troponin:   Recent Labs  Lab 03/06/19 1047  TROPONINIHS 1,775*      Chemistry Recent Labs  Lab 03/06/19 1047  NA 139  K 3.9  CL 108  CO2 21*  GLUCOSE 115*  BUN 16  CREATININE 1.04*  CALCIUM 8.6*  GFRNONAA 54*  GFRAA >60  ANIONGAP 10    No results for input(s): PROT, ALBUMIN, AST, ALT, ALKPHOS, BILITOT in the last 168 hours. Hematology Recent Labs  Lab 03/06/19 1047  WBC 7.5  RBC 4.67  HGB 15.3*  HCT 45.9  MCV 98.3  MCH 32.8  MCHC 33.3  RDW 13.4  PLT 203   BNPNo results for input(s): BNP, PROBNP in the last 168 hours.  DDimer No results for input(s): DDIMER in the last 168 hours.   Radiology/Studies:  Dg Chest 2 View  Result Date: 03/06/2019 CLINICAL DATA:  Chest pain.  Question fall EXAM: CHEST - 2 VIEW COMPARISON:  08/03/2011 FINDINGS: Lungs are clear and well aerated. No infiltrate or effusion. Negative for heart failure Deformity of the proximal left  humerus most consistent with chronic fracture IMPRESSION: No acute abnormality. Electronically Signed   By: Marlan Palau M.D.   On: 03/06/2019 11:27    Assessment and Plan:   1. NSTEMI: Patient runs a risk of evolving STEMI.  However despite EKG changes, she is completely chest pain-free.  Initial troponin was 1700.  - pending stat echo to evaluate anterior wall motion  - although [facility] can give her medications, however current concern is that patient will not be able to sit still through cardiac catheterization. Will reassess after echo  2. HTN: managed on Accupril at home  3. Alzheimer's disease: Alert but unable to recall the year, location or who is the president. Able to recognize her son.  Severity of Illness: The appropriate patient status for this patient is INPATIENT. Inpatient status is judged to be reasonable and necessary in order to provide the required intensity of service to ensure the patient's safety. The patient's presenting symptoms, physical exam findings, and initial radiographic and laboratory data in the context of their chronic comorbidities is felt to place them at high risk for further clinical deterioration. Furthermore, it is not anticipated that the patient will be medically stable for discharge from the hospital within 2 midnights of admission. The following factors support the patient status of inpatient.   " The patient's presenting symptoms include chest pain. " The worrisome physical exam findings include benign. " The initial radiographic and laboratory data are worrisome because of elevated troponin. " The chronic co-morbidities include HTN.   * I certify that at the point of admission it is my clinical judgment that the patient will require inpatient hospital care spanning beyond 2 midnights from the point of admission due to high intensity of service, high risk for further deterioration  and high frequency of surveillance required.*    For  questions or updates, please contact [facility] Please consult [email or web address] for contact info under        Ramond DialSigned, Dhanvi Boesen, [place]  03/06/2019 1:03 PM

## 2019-03-06 NOTE — ED Provider Notes (Addendum)
[facility] EMERGENCY DEPARTMENT Provider Note   CSN: [id number] Arrival date & time: 03/06/19  1028     History   Chief Complaint Chief Complaint  Patient presents with  . Chest Pain    HPI Debbie Sims is a 73 y.o. female.     HPI  Level 5 caveat for dementia.  73 year old female comes in a chief complaint of chest pain. She has history of depression, hypertension, no CAD.  She resides at [facility] independent living and according to the EMS report had sudden onset of chest tightness and difficulty in breathing that lasted for few minutes.  Patient has no complaints from her side.  She wants to go home.  She could not recall any episode of chest pain today.  She denies any cough, DIB.  Past Medical History:  Diagnosis Date  . Alzheimer's disease 07/21/2015   Moderate severity  . Complication of anesthesia    slow to wake up  . Depression   . Hypertension   . Memory difficulty 07/20/2015  . Vitamin B12 deficiency     Patient Active Problem List   Diagnosis Date Noted  . Alzheimer's disease (HCC) 07/21/2015  . Memory difficulty 07/20/2015  . Cognitive decline 12/16/2013  . Right radial head fracture 11/27/2013    Past Surgical History:  Procedure Laterality Date  . APPENDECTOMY    . ORIF ELBOW FRACTURE Right 11/27/2013   Procedure: OPEN REDUCTION INTERNAL FIXATION (ORIF) ELBOW/OLECRANON FRACTURE and Radial head replacement;  Surgeon: Sharma Covert, MD;  Location: [facility];  Service: Orthopedics;  Laterality: Right;     OB History   No obstetric history on file.      Home Medications    Prior to Admission medications   Medication Sig Start Date End Date Taking? Authorizing Provider  Cholecalciferol (VITAMIN D PO) Take 1 tablet by mouth daily.    Yes [provider]  Memantine HCl-Donepezil HCl (NAMZARIC) 28-10 MG CP24 Take 1 capsule by mouth daily. Please keep your pending appt. with Dolores Hoose, NP on 06/18/17, arrival time  of 12:30pm. Patient taking differently: Take 1 capsule by mouth daily.  03/04/18  Yes York Spaniel, MD  quinapril (ACCUPRIL) 40 MG tablet Take 40 mg by mouth daily.   Yes [provider]  sertraline (ZOLOFT) 100 MG tablet Take 100 mg by mouth daily.   Yes [provider]  vitamin C (ASCORBIC ACID) 500 MG tablet Take 1 tablet (500 mg total) by mouth daily. 11/28/13   Bradly Bienenstock, MD    Family History Family History  Problem Relation Age of Onset  . Prostate cancer Father   . Heart disease Father   . Kidney disease Father   . Dementia Father   . Arthritis Mother   . Stroke Mother   . Dementia Maternal Aunt   . Dementia Maternal Uncle     Social History Social History   Tobacco Use  . Smoking status: Never Smoker  . Smokeless tobacco: Never Used  Substance Use Topics  . Alcohol use: Yes    Alcohol/week: 2.0 standard drinks    Types: 2 Standard drinks or equivalent per week  . Drug use: No     Allergies   Amoxicillin, Trazodone, and Erythromycin   Review of Systems Review of Systems  Unable to perform ROS: Dementia     Physical Exam Updated Vital Signs BP (!) 144/102   Pulse 64   Temp 98.5 F (36.9 C) (Oral)  Resp 15   Wt 54.4 kg   SpO2 98%   BMI 21.60 kg/m   Physical Exam Vitals signs and nursing note reviewed.  Constitutional:      Appearance: She is well-developed.  HENT:     Head: Normocephalic and atraumatic.  Neck:     Musculoskeletal: Normal range of motion and neck supple.  Cardiovascular:     Rate and Rhythm: Normal rate.     Pulses:          Radial pulses are 2+ on the right side and 2+ on the left side.     Heart sounds: Normal heart sounds.  Pulmonary:     Effort: Pulmonary effort is normal.  Abdominal:     General: Bowel sounds are normal.  Skin:    General: Skin is warm and dry.  Neurological:     Mental Status: She is alert and oriented to person, place, and time.      ED Treatments / Results  Labs  (all labs ordered are listed, but only abnormal results are displayed) Labs Reviewed  BASIC METABOLIC PANEL - Abnormal; Notable for the following components:      Result Value   CO2 21 (*)    Glucose, Bld 115 (*)    Creatinine, Ser 1.04 (*)    Calcium 8.6 (*)    GFR calc non Af Amer 54 (*)    All other components within normal limits  CBC - Abnormal; Notable for the following components:   Hemoglobin 15.3 (*)    All other components within normal limits  TROPONIN I (HIGH SENSITIVITY) - Abnormal; Notable for the following components:   Troponin I (High Sensitivity) 1,775 (*)    All other components within normal limits  SARS CORONAVIRUS 2 (HOSPITAL ORDER, PERFORMED IN [facility])  HEPARIN LEVEL (UNFRACTIONATED)  TROPONIN I (HIGH SENSITIVITY)    EKG EKG Interpretation  Date/Time:  Thursday March 06 2019 10:37:22 EDT Ventricular Rate:  67 PR Interval:    QRS Duration: 130 QT Interval:  474 QTC Calculation: 501 R Axis:   -26 Text Interpretation:  Sinus or ectopic atrial rhythm Multiform ventricular premature complexes Nonspecific intraventricular conduction delay Nonspecific T abnormalities, anterior leads Borderline ST elevation, anterior leads (v2, v3) No acute changes No old tracing to compare Reconfirmed by Derwood Kaplan [phone]) on 03/06/2019 11:51:51 AM   ED ECG REPORT   Date: 03/06/2019 at 12:01  Rate: 67  Rhythm: normal sinus rhythm  QRS Axis: normal  Intervals: normal  ST/T Wave abnormalities: nonspecific ST changes  Conduction Disutrbances:none  Narrative Interpretation:   Old EKG Reviewed: unchanged  I have personally reviewed the EKG tracing and agree with the computerized printout as noted.  ST elevation in V2 V3 noted again.  Those changes are unchanged.  Radiology Dg Chest 2 View  Result Date: 03/06/2019 CLINICAL DATA:  Chest pain.  Question fall EXAM: CHEST - 2 VIEW COMPARISON:  08/03/2011 FINDINGS: Lungs are clear and well aerated. No  infiltrate or effusion. Negative for heart failure Deformity of the proximal left humerus most consistent with chronic fracture IMPRESSION: No acute abnormality. Electronically Signed   By: Marlan Palau M.D.   On: 03/06/2019 11:27    Procedures .Critical Care Performed by: Derwood Kaplan, MD Authorized by: Derwood Kaplan, MD   Critical care provider statement:    Critical care time (minutes):  40   Critical care was time spent personally by me on the following activities:  Discussions with consultants, evaluation of  patient's response to treatment, examination of patient, ordering and performing treatments and interventions, ordering and review of laboratory studies, ordering and review of radiographic studies, pulse oximetry, re-evaluation of patient's condition, obtaining history from patient or surrogate and review of old charts   (including critical care time)  Medications Ordered in ED Medications  aspirin chewable tablet 324 mg (324 mg Oral Not Given 03/06/19 1208)  heparin ADULT infusion 100 units/mL (25000 units/227mL sodium chloride 0.45%) (650 Units/hr Intravenous New Bag/Given 03/06/19 1302)  heparin bolus via infusion 3,000 Units (3,000 Units Intravenous Bolus from Bag 03/06/19 1303)     Initial Impression / Assessment and Plan / ED Course  I have reviewed the triage vital signs and the nursing notes.  Pertinent labs & imaging results that were available during my care of the patient were reviewed by me and considered in my medical decision making (see chart for details).  Clinical Course as of Mar 05 1306  Thu Mar 06, 2019  1206 Elevated. Repeat ekg ordered and reviewed  Troponin I (High Sensitivity)(!!): 1,775 [AN]    Clinical Course User Index [AN] Varney Biles, MD       Patient comes with chief complaint of chest pain. Patient is not able to provide meaningful history because of her dementia.  She is actually fully dressed, has pulled out her IV and trying  to leave on my assessment.  We were able to direct her onto the bed.  She informs me that she is here because of the fall.  She has no chest pain at this time and denies any chest pain earlier today. We were able to get a hold of patient's son who informed me that patient is not reliable with her history because of memory issues.  However if she was having active discomfort she would tell us.  Patient has no known history of CAD.  She does not smoke.  There is no history of blood clot in the legs or lungs and patient is ambulating.  Reassessment: Patient serial EKG is unchanged.  She has ST elevation in anterior leads.  Initial troponin was elevated.  We spoke with cardiology immediately and they had rapid assessment completed of the patient.  They agree with the diagnosis of an STEMI.  They will decide if patient is a good candidate for medical management versus cardiac cath.  Final Clinical Impressions(s) / ED Diagnoses   Final diagnoses:  NSTEMI (non-ST elevated myocardial infarction) [facility])    ED Discharge Orders    None       Varney Biles, MD 03/06/19 [place], [place]on Milne, MD 03/06/19 1524

## 2019-03-06 NOTE — ED Notes (Signed)
Report attempted 

## 2019-03-06 NOTE — ED Notes (Signed)
Plan of care discussed with the patient's son, who is at bedside. He and the patient were informed a Covid swab would be needed for admission purposes. Pt's son requested for me to wait to swab her until the second troponin results and a definitve plan of care regarding admission has been discussed between them and the physicians.

## 2019-03-06 NOTE — ED Triage Notes (Addendum)
PER EMS: pt is from [facility] with c/o sudden onset of chest tightness and trouble breathing that lasted 30 minutes. Symptom free currently. Poor historian due to dementia, which is baseline. Pt received 324 aspirin with EMS. BP-148/110, HR-73, 99% RA no distress.

## 2019-03-06 NOTE — ED Notes (Signed)
Attempted to obtain blood work but was unsuccessful.Rn made aware.

## 2019-03-06 NOTE — Progress Notes (Signed)
ANTICOAGULATION CONSULT NOTE - Initial Consult  Pharmacy Consult for Heparin dosing Indication: chest pain/ACS  Allergies  Allergen Reactions  . Amoxicillin Other (See Comments)    Has patient had a PCN reaction causing immediate rash, facial/tongue/throat swelling, SOB or lightheadedness with hypotension: No Has patient had a PCN reaction causing severe rash involving mucus membranes or skin necrosis: No Has patient had a PCN reaction that required hospitalization No Has patient had a PCN reaction occurring within the last 10 years: No If all of the above answers are "NO", then may proceed with Cephalosporin use.  . Trazodone Other (See Comments)  . Erythromycin Rash    Patient Measurements: Weight: 120 lb (54.4 kg) Heparin Dosing Weight: 54.4kg  Vital Signs: Temp: 98.5 F (36.9 C) (10/08 1034) Temp Source: Oral (10/08 1034) BP: 135/95 (10/08 1142) Pulse Rate: 72 (10/08 1142)  Labs: Recent Labs    03/06/19 1047  HGB 15.3*  HCT 45.9  PLT 203  CREATININE 1.04*  TROPONINIHS 1,775*    CrCl cannot be calculated (Unknown ideal weight.).   Medical History: Past Medical History:  Diagnosis Date  . Alzheimer's disease 07/21/2015   Moderate severity  . Complication of anesthesia    slow to wake up  . Depression   . Hypertension   . Memory difficulty 07/20/2015  . Vitamin B12 deficiency     Medications:  Infusions:  . heparin      Assessment: 74 yof presenting with chest pain to the ED. Was found to have an elevated troponin. Pharmacy was consulted to dose heparin for ACS  Goal of Therapy:  Heparin level 0.3-0.7 units/ml    Plan:  - Will give a Heparin bolus of 3000 units  - Followed by an infusion of Heparin at 650 units/hr  - Will obtain a heparin level in 6 hours - Monitor patient's CBC and for signs and symptoms of bleeding.  -Daily HL and [place] PharmD. BCPS  03/06/2019,12:38 PM

## 2019-03-07 ENCOUNTER — Encounter (HOSPITAL_COMMUNITY): Payer: Self-pay | Admitting: General Practice

## 2019-03-07 ENCOUNTER — Telehealth: Payer: Self-pay | Admitting: Student

## 2019-03-07 ENCOUNTER — Other Ambulatory Visit: Payer: Self-pay

## 2019-03-07 LAB — BASIC METABOLIC PANEL
Anion gap: 11 (ref 5–15)
BUN: 16 mg/dL (ref 8–23)
CO2: 21 mmol/L — ABNORMAL LOW (ref 22–32)
Calcium: 9 mg/dL (ref 8.9–10.3)
Chloride: 108 mmol/L (ref 98–111)
Creatinine, Ser: 0.82 mg/dL (ref 0.44–1.00)
GFR calc Af Amer: >60 mL/min (ref >60)
GFR calc non Af Amer: >60 mL/min (ref >60)
Glucose, Bld: 120 mg/dL — ABNORMAL HIGH (ref 70–99)
Potassium: 4.2 mmol/L (ref 3.5–5.1)
Sodium: 140 mmol/L (ref 135–145)

## 2019-03-07 LAB — CBC
HCT: 43.5 % (ref 36.0–46.0)
Hemoglobin: 14.2 g/dL (ref 12.0–15.0)
MCH: 32.9 pg (ref 26.0–34.0)
MCHC: 32.6 g/dL (ref 30.0–36.0)
MCV: 100.7 fL — ABNORMAL HIGH (ref 80.0–100.0)
Platelets: 184 10*3/uL (ref 150–400)
RBC: 4.32 MIL/uL (ref 3.87–5.11)
RDW: 13.4 % (ref 11.5–15.5)
WBC: 6.2 10*3/uL (ref 4.0–10.5)
nRBC: 0 % (ref 0.0–0.2)

## 2019-03-07 LAB — LIPID PANEL
Cholesterol: 228 mg/dL — ABNORMAL HIGH (ref 0–200)
HDL: 66 mg/dL (ref >40)
LDL Cholesterol: 138 mg/dL — ABNORMAL HIGH (ref 0–99)
Total CHOL/HDL Ratio: 3.5 RATIO
Triglycerides: 122 mg/dL (ref <150)
VLDL: 24 mg/dL (ref 0–40)

## 2019-03-07 LAB — HEPARIN LEVEL (UNFRACTIONATED)
Heparin Unfractionated: 0.19 IU/mL — ABNORMAL LOW (ref 0.30–0.70)
Heparin Unfractionated: 0.33 IU/mL (ref 0.30–0.70)

## 2019-03-07 LAB — PROTIME-INR
INR: 1.1 (ref 0.8–1.2)
Prothrombin Time: 13.9 seconds (ref 11.4–15.2)

## 2019-03-07 LAB — HEMOGLOBIN A1C
Hgb A1c MFr Bld: 5.6 % (ref 4.8–5.6)
Mean Plasma Glucose: 114.02 mg/dL

## 2019-03-07 MED ORDER — CLOPIDOGREL BISULFATE 75 MG PO TABS
300.0000 mg | ORAL_TABLET | Freq: Once | ORAL | Status: AC
  Administered 2019-03-07: 12:00:00 300 mg via ORAL
  Filled 2019-03-07: qty 4

## 2019-03-07 MED ORDER — ENOXAPARIN SODIUM 60 MG/0.6ML ~~LOC~~ SOLN
50.0000 mg | Freq: Two times a day (BID) | SUBCUTANEOUS | Status: DC
  Administered 2019-03-07 – 2019-03-08 (×3): 50 mg via SUBCUTANEOUS
  Filled 2019-03-07 (×3): qty 0.6

## 2019-03-07 MED ORDER — WARFARIN SODIUM 7.5 MG PO TABS
7.5000 mg | ORAL_TABLET | Freq: Once | ORAL | Status: AC
  Administered 2019-03-07: 7.5 mg via ORAL
  Filled 2019-03-07: qty 1

## 2019-03-07 MED ORDER — CLOPIDOGREL BISULFATE 75 MG PO TABS
75.0000 mg | ORAL_TABLET | Freq: Every day | ORAL | Status: DC
  Administered 2019-03-08: 75 mg via ORAL
  Filled 2019-03-07: qty 1

## 2019-03-07 MED ORDER — HEPARIN BOLUS VIA INFUSION
800.0000 [IU] | Freq: Once | INTRAVENOUS | Status: AC
  Administered 2019-03-07: 800 [IU] via INTRAVENOUS
  Filled 2019-03-07: qty 800

## 2019-03-07 NOTE — Progress Notes (Signed)
ANTICOAGULATION CONSULT NOTE - Initial Consult  Pharmacy Consult for Heparin dosing Indication: LV thrombus  Allergies  Allergen Reactions  . Amoxicillin Other (See Comments)    Has patient had a PCN reaction causing immediate rash, facial/tongue/throat swelling, SOB or lightheadedness with hypotension: No Has patient had a PCN reaction causing severe rash involving mucus membranes or skin necrosis: No Has patient had a PCN reaction that required hospitalization No Has patient had a PCN reaction occurring within the last 10 years: No If all of the above answers are "NO", then may proceed with Cephalosporin use.  . Trazodone Other (See Comments)  . Erythromycin Rash    Patient Measurements: Weight: 120 lb (54.4 kg) Heparin Dosing Weight: 54.4kg  Vital Signs: BP: 100/74 (10/09 0300) Pulse Rate: 63 (10/09 0300)  Labs: Recent Labs    03/06/19 1047 03/06/19 1242 03/06/19 1917 03/07/19 0249  HGB 15.3*  --   --  14.2  HCT 45.9  --   --  43.5  PLT 203  --   --  184  LABPROT  --   --   --  13.9  INR  --   --   --  1.1  HEPARINUNFRC  --   --  0.30 0.19*  CREATININE 1.04*  --   --  0.82  TROPONINIHS 1,775* 2,045* 4,378*  --     CrCl cannot be calculated (Unknown ideal weight.).  Assessment: 50 yof presenting with chest pain to the ED. Was found to have an elevated troponin. She was initially worked up for a NSTEMI. Echo demonstrated LV thrombus and was then put on IV heparin  Hep lvl low 0.19, INR 1.1 Warfarin not given last night  Goal of Therapy:  Heparin level 0.3-0.7 units/ml INR Goal 2-3  Plan:  Heparin bolus 800 units x 1 Heparin gtt increase to 750 units/hr 1200 HL Warfarin to be dosed for tonight at later time  Levester Fresh, PharmD, BCPS, BCCCP Clinical Pharmacist [phone]  Please check AMION for all [place] numbers  03/07/2019 3:46 AM

## 2019-03-07 NOTE — Telephone Encounter (Signed)
   FYI to [facility] pool - this patient might go home tomorrow from the hospital. It is not yet determined where she will go at discharge. She came from an assisted living facility but is quite demented and agitated here in the hospital so might end up going to SNF.   Nevertheless, in order to try and help the weekend team out, I've preemptively made a f/u appt with [name] PA-C for this patient on 03/19/19 at 11am.  She will need TOC call after discharge if she goes back to ALF - Her son Gerald Stabs has been assisting with her care while inpatient as is her current point of contact. If she goes to SNF, however, my understanding is that those patients do not need calls. Thanks.  Nyxon Strupp PA-C

## 2019-03-07 NOTE — ED Notes (Signed)
SDU  Breakfast ordered  

## 2019-03-07 NOTE — Progress Notes (Signed)
Progress Note  Patient Name: Debbie Sims Date of Encounter: 03/07/2019  Primary Cardiologist: No primary care provider on file.   Subjective   Feeling well.  No chest pain or shortness of breath. Confused about where she is and why.   Inpatient Medications    Scheduled Meds: . aspirin EC  81 mg Oral Daily  . atorvastatin  80 mg Oral q1800  . clopidogrel  300 mg Oral Once  . [START ON 03/08/2019] clopidogrel  75 mg Oral Daily  . isosorbide mononitrate  30 mg Oral Daily  . metoprolol tartrate  12.5 mg Oral BID  . quinapril  40 mg Oral Daily  . Warfarin - Pharmacist Dosing Inpatient   Does not apply q1800   Continuous Infusions: . heparin 750 Units/hr (03/07/19 0351)   PRN Meds: acetaminophen, nitroGLYCERIN, ondansetron (ZOFRAN) IV   Vital Signs    Vitals:   03/07/19 0806 03/07/19 0855 03/07/19 0856 03/07/19 1013  BP:  114/83 114/83   Pulse: 69 83  76  Resp: 19 17    Temp: 97.9 F (36.6 C)     TempSrc: Oral     SpO2: 98% 97%  97%  Weight:        Intake/Output Summary (Last 24 hours) at 03/07/2019 1137 Last data filed at 03/07/2019 1015 Gross per 24 hour  Intake 100 ml  Output -  Net 100 ml   Last 3 Weights 03/06/2019 07/13/2017 02/12/2017  Weight (lbs) 120 lb 114 lb 111 lb 8 oz  Weight (kg) 54.432 kg 51.71 kg 50.576 kg      Telemetry    Sinus rhythm.  No events  - Personally Reviewed  ECG    Sinus rhythm.  Rate 63 bpm.  Nonspecific ST-T changes anterolaterally  - Personally Revieed  Physical Exam   VS:  BP 114/83 (BP Location: Left Arm)   Pulse 76   Temp 97.9 F (36.6 C) (Oral)   Resp 17   Wt 54.4 kg   SpO2 97%   BMI 21.60 kg/m  , BMI Body mass index is 21.6 kg/m. GENERAL:  Well appearing.  NO acute distress. HEENT: Pupils equal round and reactive, fundi not visualized, oral mucosa unremarkable NECK:  No jugular venous distention, waveform within normal limits, carotid upstroke brisk and symmetric, no bruits LUNGS:  Clear to auscultation  bilaterally HEART:  RRR.  PMI not displaced or sustained,S1 and S2 within normal limits, no S3, no S4, no clicks, no rubs, no murmurs ABD:  Flat, positive bowel sounds normal in frequency in pitch, no bruits, no rebound, no guarding, no midline pulsatile mass, no hepatomegaly, no splenomegaly EXT:  2 plus pulses throughout, no edema, no cyanosis no clubbing SKIN:  No rashes no nodules NEURO:  Cranial nerves II through XII grossly intact, motor grossly intact throughout PSYCH:  Oriented to person   Labs    High Sensitivity Troponin:   Recent Labs  Lab 03/06/19 1047 03/06/19 1242 03/06/19 1917  TROPONINIHS 1,775* 2,045* 4,378*      Chemistry Recent Labs  Lab 03/06/19 1047 03/07/19 0249  NA 139 140  K 3.9 4.2  CL 108 108  CO2 21* 21*  GLUCOSE 115* 120*  BUN 16 16  CREATININE 1.04* 0.82  CALCIUM 8.6* 9.0  GFRNONAA 54* >60  GFRAA >60 >60  ANIONGAP 10 11     Hematology Recent Labs  Lab 03/06/19 1047 03/07/19 0249  WBC 7.5 6.2  RBC 4.67 4.32  HGB 15.3* 14.2  HCT 45.9  43.5  MCV 98.3 100.7*  MCH 32.8 32.9  MCHC 33.3 32.6  RDW 13.4 13.4  PLT 203 184    BNPNo results for input(s): BNP, PROBNP in the last 168 hours.   DDimer No results for input(s): DDIMER in the last 168 hours.   Radiology    Dg Chest 2 View  Result Date: 03/06/2019 CLINICAL DATA:  Chest pain.  Question fall EXAM: CHEST - 2 VIEW COMPARISON:  08/03/2011 FINDINGS: Lungs are clear and well aerated. No infiltrate or effusion. Negative for heart failure Deformity of the proximal left humerus most consistent with chronic fracture IMPRESSION: No acute abnormality. Electronically Signed   By: Marlan Palau M.D.   On: 03/06/2019 11:27    Cardiac Studies   Echo 03/06/19: IMPRESSIONS    1. Left ventricular ejection fraction, by visual estimation, is 35-40%. The left ventricle has moderately reduced LVF. Normal left ventricular size. There is moderately increased left ventricular hypertrophy of the  basal septum.  2. Definity contrast agent was given IV to delineate the left ventricular endocardial borders.  3. There is akinesis of the apical, apical anterior, basal and mid antero and inferoseptum, apical septal, apical inferior walls with LV apical mural thrombus.  4. Global right ventricle has normal systolic function.The right ventricular size is normal. No increase in right ventricular wall thickness.  5. Left atrial size was normal.  6. Right atrial size was normal.  7. The mitral valve is normal in structure. No evidence of mitral valve regurgitation. No evidence of mitral stenosis.  8. The tricuspid valve is normal in structure. Tricuspid valve regurgitation was not visualized by color flow Doppler.  9. The aortic valve is normal in structure. Aortic valve regurgitation was not visualized by color flow Doppler. Structurally normal aortic valve, with no evidence of sclerosis or stenosis. 10. The pulmonic valve was normal in structure. Pulmonic valve regurgitation is not visualized by color flow Doppler. 11. The inferior vena cava is normal in size with greater than 50% respiratory variability, suggesting right atrial pressure of 3 mmHg. 12. Left ventricular diastolic Doppler parameters are consistent with impaired relaxation pattern of LV diastolic filling. 13. TR signal is inadequate for assessing pulmonary artery systolic pressure.   Patient Profile     Debbie Sims is a 80F with advanced Alzheimer's dementia and hypertension admitted with NSTEMI.  She was found to have an anterior MI with reduced systolic function and LV thrombus.  Medically managed due to dementia.  Assessment & Plan    # NSTEMI:  # Hyperlipidemia:  Medically managed due to dementia.  Continue aspirin, clopidogrel, Imdur and metoprolol. LDL 138 this admission. Continue atorvastatin, which was started this admission. Will stop aspirin at discharge given that she will be on warfarin too.   # Acute  systolic/diastolic heart failure: LVEF 35-40%. Presumably due to anterior MI.  She has no volume overload on exam.  Will need to weigh daily at discharge.    # Apical thrombus: She is currently on heparin and transitioning to coumadin.  Her son is concerned about her ability to stay in the hospital and the visiting restrictions while awaiting a therapeutic INR.  We will plan to bridge her with enoxaparin.  We will contact care management to help him with this transition.  INR goal 2-3.  Would repeat echo in a few months to reassess.  # Hypertension: Hold home quinapril.  She received a dose 10/9.  Will plan to start Entresto if BP tolerates.   #  Dementia: Needs family/sitter in room.  Care management/PT evaluations to make sure she has a safe discharge plan.   For questions or updates, please contact [facility] Please consult [email or web address] for contact info under        Signed, Chilton Si, MD  03/07/2019, 11:37 AM

## 2019-03-07 NOTE — Progress Notes (Addendum)
Dr. Blenda Mounts formal rounding note to follow but she called me to discuss patient's plan of care. Sounds like son has had some issues with reorienting patient in unfamiliar environment in the hospital and the thought is that she might do better at home. Dr. Oval Linsey recommends transitioning heparin to Lovenox for bridging at home while we await therapeutic INR in anticipation of possible DC tomorrow. Will consult pharmacy and also place care management consult to help assist. She came from independent living. Given difficulty with agitation/lack of cooperation this admission, may ultimately require SNF so will place formal PT consult to evaluate. Dr. Oval Linsey feels she is medically OK for PT eval today to help arrive at a dispo (where she goes will also determine plan for INR management). She does not seem appropriate for cardiac rehab at this time so will hold off on consulting them.  Will also go ahead and arrange f/u [facility] appointment preemptively - this is scheduled for 10/21 at [place] [facility]

## 2019-03-07 NOTE — Progress Notes (Addendum)
ANTICOAGULATION CONSULT NOTE - [place] for Heparin to enoxaparin + warfarin Indication: LV thrombus  Allergies  Allergen Reactions  . Amoxicillin Other (See Comments)    Has patient had a PCN reaction causing immediate rash, facial/tongue/throat swelling, SOB or lightheadedness with hypotension: No Has patient had a PCN reaction causing severe rash involving mucus membranes or skin necrosis: No Has patient had a PCN reaction that required hospitalization No Has patient had a PCN reaction occurring within the last 10 years: No If all of the above answers are "NO", then may proceed with Cephalosporin use.  . Trazodone Other (See Comments)  . Erythromycin Rash    Patient Measurements: Weight: 120 lb (54.4 kg) Heparin Dosing Weight: 54.4kg  Vital Signs: Temp: 97.9 F (36.6 C) (10/09 0806) Temp Source: Oral (10/09 0806) BP: 114/83 (10/09 0856) Pulse Rate: 76 (10/09 1013)  Labs: Recent Labs    03/06/19 1047 03/06/19 1242 03/06/19 1917 03/07/19 0249  HGB 15.3*  --   --  14.2  HCT 45.9  --   --  43.5  PLT 203  --   --  184  LABPROT  --   --   --  13.9  INR  --   --   --  1.1  HEPARINUNFRC  --   --  0.30 0.19*  CREATININE 1.04*  --   --  0.82  TROPONINIHS 1,775* 2,045* 4,378*  --     CrCl cannot be calculated (Unknown ideal weight.).  Assessment: 58 yoF admitted with NSTEMI planned to be ruled medically. ECHO demonstrated new LV thrombus. Pt started on IV heparin, warfarin, and DAPT (clopidogrel + ASA).  Pt refused warfarin last night, INR 1.1 as expected. CBC stable, heparin level therapeutic at 0.33. Pharmacy asked to transition to enoxaparin in anticipation of discharge, likely to SNF.   Goal of Therapy:  Heparin level 0.3-0.7 units/ml INR Goal 2-3  Plan:  -Stop IV heparin, in 1 hr start enoxaparin 50mg  SQ q12h -Warfarin 7.5mg  PO x1 tonight -Daily INR -Recommend discontinuing aspirin, as it will likely provide minimal benefit in the  setting of warfarin and clopidogrel   Arrie Senate, PharmD, BCPS Clinical Pharmacist [phone] Please check AMION for all [place] numbers 03/07/2019

## 2019-03-07 NOTE — Plan of Care (Signed)
  Problem: Clinical Measurements: Goal: Will remain free from infection Outcome: Progressing Goal: Respiratory complications will improve Outcome: Progressing   Problem: Activity: Goal: Risk for activity intolerance will decrease Outcome: Progressing   Problem: Nutrition: Goal: Adequate nutrition will be maintained Outcome: Progressing   Problem: Coping: Goal: Level of anxiety will decrease Outcome: Progressing   

## 2019-03-08 ENCOUNTER — Encounter (HOSPITAL_COMMUNITY): Payer: Self-pay | Admitting: Physician Assistant

## 2019-03-08 DIAGNOSIS — E785 Hyperlipidemia, unspecified: Secondary | ICD-10-CM

## 2019-03-08 DIAGNOSIS — Z7901 Long term (current) use of anticoagulants: Secondary | ICD-10-CM

## 2019-03-08 DIAGNOSIS — I513 Intracardiac thrombosis, not elsewhere classified: Secondary | ICD-10-CM

## 2019-03-08 DIAGNOSIS — I5041 Acute combined systolic (congestive) and diastolic (congestive) heart failure: Secondary | ICD-10-CM

## 2019-03-08 LAB — CBC
HCT: 43 % (ref 36.0–46.0)
Hemoglobin: 13.9 g/dL (ref 12.0–15.0)
MCH: 32.1 pg (ref 26.0–34.0)
MCHC: 32.3 g/dL (ref 30.0–36.0)
MCV: 99.3 fL (ref 80.0–100.0)
Platelets: 192 10*3/uL (ref 150–400)
RBC: 4.33 MIL/uL (ref 3.87–5.11)
RDW: 13.2 % (ref 11.5–15.5)
WBC: 7 10*3/uL (ref 4.0–10.5)
nRBC: 0 % (ref 0.0–0.2)

## 2019-03-08 LAB — COMPREHENSIVE METABOLIC PANEL
ALT: 14 U/L (ref 0–44)
AST: 23 U/L (ref 15–41)
Albumin: 3.5 g/dL (ref 3.5–5.0)
Alkaline Phosphatase: 60 U/L (ref 38–126)
Anion gap: 11 (ref 5–15)
BUN: 11 mg/dL (ref 8–23)
CO2: 21 mmol/L — ABNORMAL LOW (ref 22–32)
Calcium: 9.2 mg/dL (ref 8.9–10.3)
Chloride: 106 mmol/L (ref 98–111)
Creatinine, Ser: 0.8 mg/dL (ref 0.44–1.00)
GFR calc Af Amer: >60 mL/min (ref >60)
GFR calc non Af Amer: >60 mL/min (ref >60)
Glucose, Bld: 109 mg/dL — ABNORMAL HIGH (ref 70–99)
Potassium: 3.8 mmol/L (ref 3.5–5.1)
Sodium: 138 mmol/L (ref 135–145)
Total Bilirubin: 0.7 mg/dL (ref 0.3–1.2)
Total Protein: 6.6 g/dL (ref 6.5–8.1)

## 2019-03-08 LAB — PROTIME-INR
INR: 1.2 (ref 0.8–1.2)
Prothrombin Time: 15.1 seconds (ref 11.4–15.2)

## 2019-03-08 MED ORDER — ISOSORBIDE MONONITRATE ER 30 MG PO TB24: 30.0000 mg | ORAL_TABLET | Freq: Every day | ORAL | 3 refills | Status: AC

## 2019-03-08 MED ORDER — ATORVASTATIN CALCIUM 80 MG PO TABS: 80.0000 mg | ORAL_TABLET | Freq: Every day | ORAL | 3 refills | Status: DC

## 2019-03-08 MED ORDER — METOPROLOL TARTRATE 25 MG PO TABS: 12.5000 mg | ORAL_TABLET | Freq: Two times a day (BID) | ORAL | 3 refills | Status: DC

## 2019-03-08 MED ORDER — CLOPIDOGREL BISULFATE 75 MG PO TABS: 75.0000 mg | ORAL_TABLET | Freq: Every day | ORAL | 3 refills | Status: AC

## 2019-03-08 MED ORDER — ENOXAPARIN SODIUM 60 MG/0.6ML ~~LOC~~ SOLN: 60.0000 mg | Freq: Two times a day (BID) | SUBCUTANEOUS | 1 refills | Status: DC

## 2019-03-08 MED ORDER — WARFARIN SODIUM 5 MG PO TABS: ORAL_TABLET | ORAL | 1 refills | Status: DC

## 2019-03-08 MED ORDER — NITROGLYCERIN 0.4 MG SL SUBL: 0.4000 mg | SUBLINGUAL_TABLET | SUBLINGUAL | 3 refills | Status: AC | PRN

## 2019-03-08 MED ORDER — WARFARIN SODIUM 7.5 MG PO TABS: 7.5000 mg | ORAL_TABLET | Freq: Once | ORAL | Status: DC

## 2019-03-08 NOTE — Plan of Care (Signed)

## 2019-03-08 NOTE — TOC Progression Note (Addendum)
Transition of Care [facility]) - Progression Note    Patient Details  Name: Debbie Sims MRN: [id number] Date of Birth: Jun 10, 1945  Transition of Care [facility]) CM/SW Contact  Zenon Mayo, RN Phone Number: 03/08/2019, 1:02 PM  Clinical Narrative:    lovenox prescription was 19.00 per pharmacy at [facility].  The medication has been picked up already.  Patient will need HHRN to help assist in giving the lovenox injection, son states he has no preference. NCM made referral to [facility] with Alvis Lemmings , he states they can take referral and soc will be Monday , they will be able to do M, T, W, Thur for 1 dose of lovenox.         Expected Discharge Plan and Services           Expected Discharge Date: 03/08/19                                     Social Determinants of Health (SDOH) Interventions    Readmission Risk Interventions No flowsheet data found.

## 2019-03-08 NOTE — Progress Notes (Addendum)
ANTICOAGULATION CONSULT NOTE - [place] for Heparin to enoxaparin + warfarin Indication: LV thrombus  Allergies  Allergen Reactions  . Amoxicillin Other (See Comments)    Has patient had a PCN reaction causing immediate rash, facial/tongue/throat swelling, SOB or lightheadedness with hypotension: No Has patient had a PCN reaction causing severe rash involving mucus membranes or skin necrosis: No Has patient had a PCN reaction that required hospitalization No Has patient had a PCN reaction occurring within the last 10 years: No If all of the above answers are "NO", then may proceed with Cephalosporin use.  . Trazodone Other (See Comments)  . Erythromycin Rash    Patient Measurements: Height: 5' (152.4 cm) Weight: 122 lb 8 oz (55.6 kg) IBW/kg (Calculated) : 45.5 Heparin Dosing Weight: 54.4kg  Vital Signs: Temp: 99.4 F (37.4 C) (10/10 0618) Temp Source: Oral (10/10 0618) BP: 116/84 (10/10 0618) Pulse Rate: 71 (10/10 0618)  Labs: Recent Labs    03/06/19 1047 03/06/19 1242 03/06/19 1917 03/07/19 0249 03/07/19 1221 03/08/19 0414  HGB 15.3*  --   --  14.2  --  13.9  HCT 45.9  --   --  43.5  --  43.0  PLT 203  --   --  184  --  192  LABPROT  --   --   --  13.9  --  15.1  INR  --   --   --  1.1  --  1.2  HEPARINUNFRC  --   --  0.30 0.19* 0.33  --   CREATININE 1.04*  --   --  0.82  --  0.80  TROPONINIHS 1,775* 2,045* 4,378*  --   --   --     Estimated Creatinine Clearance: 50.4 mL/min (by C-G formula based on SCr of 0.8 mg/dL).  Assessment: 82 yoF admitted with NSTEMI planned to be treated medically. ECHO demonstrated new LV thrombus. Pt started on IV heparin, warfarin, and DAPT (clopidogrel + ASA).  Pt refused warfarin on 10/8, INR on 10/9 was 1.1 as expected. Pharmacy asked to transition to enoxaparin in anticipation of discharge, likely to SNF.  Patient is currently on 1mg /kg Q12H of enoxaparin (50mg  Q12H) plus warfarin bridging to an INR  of 2-3.   Today's update INR this morning is 1.2. Will give another dose of warfarin 7.5mg  tonight and continue therapeutic dose of enoxaparin. Pt will need f/u outpatient to monitor INR if discharged prior to being therapeutic on warfarin.   Goal of Therapy:  Heparin level 0.3-0.7 units/ml INR Goal 2-3  Plan:  -Continue enoxaparin 50mg  SQ q12h -Warfarin 7.5mg  PO x1 tonight -Daily INR and CBC  Plan for dischare: -Exoxaparin 60mg  Q12H -Warfarin 7.5mg  tonight then 5mg  daily -F/u in clinic next week for INR monitoring -Can d/c enoxaparin when INR therapeutic (goal range: 2-3) twice upon monitoring -Recommend discontinuing aspirin, as it will likely provide minimal benefit in the setting of warfarin and clopidogrel but might increase the risk of bleeding   Kennon Holter, PharmD PGY1 Ambulatory Care Pharmacy Resident [employer] Phone: [phone] 03/08/2019

## 2019-03-08 NOTE — Evaluation (Signed)
Physical Therapy Evaluation Patient Details Name: Debbie Sims MRN: [id number] DOB: 05/07/1946 Today's Date: 03/08/2019   History of Present Illness  Patient is a 73 year old female admitted with reports of chest pain. Patient diagnosed with NSTEMI. PMH includes HTN, Alzheimers  Clinical Impression  Patient received in bed, sitter present. Pleasant, wants to walk. Follow direction well during my assessment. Patient requires cues to wait until lines are disconnected to walk. She performed bed mobility and transfers independently. Ambulated 200 feet without AD and supervision. No overt LOB or difficulties noted. Patient required increased cues for orientation, but is otherwise safe with mobility.  Not oriented at all.      Follow Up Recommendations No PT follow up    Equipment Recommendations  None recommended by PT    Recommendations for Other Services       Precautions / Restrictions Precautions Precautions: Fall Precaution Comments: sitter Restrictions Weight Bearing Restrictions: No      Mobility  Bed Mobility Overal bed mobility: Independent                Transfers Overall transfer level: Independent                  Ambulation/Gait Ambulation/Gait assistance: Supervision Gait Distance (Feet): 200 Feet Assistive device: None Gait Pattern/deviations: WFL(Within Functional Limits);Step-through pattern Gait velocity: WNL   General Gait Details: No significant safety issues noted with mobility. No LOB, followed directions well during activity.  Stairs            Wheelchair Mobility    Modified Rankin (Stroke Patients Only)       Balance Overall balance assessment: Independent                                           Pertinent Vitals/Pain Pain Assessment: No/denies pain    Home Living Family/patient expects to be discharged to:: Assisted living Living Arrangements: Alone             Home Equipment:  None Additional Comments: patient has baseline dementia, unable to tell me    Prior Function Level of Independence: Independent               Hand Dominance        Extremity/Trunk Assessment   Upper Extremity Assessment Upper Extremity Assessment: Overall WFL for tasks assessed    Lower Extremity Assessment Lower Extremity Assessment: Overall WFL for tasks assessed    Cervical / Trunk Assessment Cervical / Trunk Assessment: Normal  Communication   Communication: No difficulties  Cognition Arousal/Alertness: Awake/alert Behavior During Therapy: WFL for tasks assessed/performed Overall Cognitive Status: No family/caregiver present to determine baseline cognitive functioning                                 General Comments: history of alzheimer's      General Comments      Exercises     Assessment/Plan    PT Assessment (she would benefit from a memory care facility)  PT Problem List         PT Treatment Interventions      PT Goals (Current goals can be found in the Care Plan section)  Acute Rehab PT Goals Patient Stated Goal: to go home PT Goal Formulation: With patient Time For Goal Achievement: 03/12/19 Potential to  Achieve Goals: Good    Frequency     Barriers to discharge        Co-evaluation               AM-PAC PT "6 Clicks" Mobility  Outcome Measure Help needed turning from your back to your side while in a flat bed without using bedrails?: None Help needed moving from lying on your back to sitting on the side of a flat bed without using bedrails?: None Help needed moving to and from a bed to a chair (including a wheelchair)?: None Help needed standing up from a chair using your arms (e.g., wheelchair or bedside chair)?: None Help needed to walk in hospital room?: None Help needed climbing 3-5 steps with a railing? : None 6 Click Score: 24    End of Session Equipment Utilized During Treatment: Gait belt Activity  Tolerance: Patient tolerated treatment well Patient left: in bed;with nursing/sitter in room Nurse Communication: Mobility status PT Visit Diagnosis: Difficulty in walking, not elsewhere classified (R26.2)    Time: 6629-4765 PT Time Calculation (min) (ACUTE ONLY): 17 min   Charges:   PT Evaluation $PT Eval Moderate Complexity: 1 Mod          Judy Goodenow, PT, GCS 03/08/19,9:53 AM

## 2019-03-08 NOTE — Plan of Care (Signed)
  Problem: Education: Goal: Knowledge of General Education information will improve Description: Including pain rating scale, medication(s)/side effects and non-pharmacologic comfort measures Outcome: Progressing   Problem: Health Behavior/Discharge Planning: Goal: Ability to manage health-related needs will improve Outcome: Progressing   Problem: Clinical Measurements: Goal: Will remain free from infection Outcome: Progressing   

## 2019-03-08 NOTE — Discharge Summary (Addendum)
Discharge Summary    Patient ID: Debbie Sims MRN: [id number]; DOB: 1945/12/06  Admit date: 03/06/2019 Discharge date: 03/08/2019  Primary Care Provider: Kathyrn Lass, MD  Primary Cardiologist: Skeet Latch, MD  Primary Electrophysiologist:  None   Discharge Diagnoses    Principal Problem:   NSTEMI (non-ST elevated myocardial infarction) [facility]) Active Problems:   Memory difficulty   Alzheimer's disease ([place])   Essential hypertension   Left ventricular apical thrombus   Anticoagulated on Coumadin   Acute combined systolic and diastolic heart failure (HCC)   Allergies Allergies  Allergen Reactions   Amoxicillin Other (See Comments)    Has patient had a PCN reaction causing immediate rash, facial/tongue/throat swelling, SOB or lightheadedness with hypotension: No Has patient had a PCN reaction causing severe rash involving mucus membranes or skin necrosis: No Has patient had a PCN reaction that required hospitalization No Has patient had a PCN reaction occurring within the last 10 years: No If all of the above answers are "NO", then may proceed with Cephalosporin use.   Trazodone Other (See Comments)   Erythromycin Rash    Diagnostic Studies/Procedures    Echo 03/06/19:  1. Left ventricular ejection fraction, by visual estimation, is 35-40%. The left ventricle has moderately reduced LVF. Normal left ventricular size. There is moderately increased left ventricular hypertrophy of the basal septum. 2. Definity contrast agent was given IV to delineate the left ventricular endocardial borders. 3. There is akinesis of the apical, apical anterior, basal and mid antero and inferoseptum, apical septal, apical inferior walls with LV apical mural thrombus. 4. Global right ventricle has normal systolic function.The right ventricular size is normal. No increase in right ventricular wall thickness. 5. Left atrial size was normal. 6. Right atrial size was normal. 7. The  mitral valve is normal in structure. No evidence of mitral valve regurgitation. No evidence of mitral stenosis. 8. The tricuspid valve is normal in structure. Tricuspid valve regurgitation was not visualized by color flow Doppler. 9. The aortic valve is normal in structure. Aortic valve regurgitation was not visualized by color flow Doppler. Structurally normal aortic valve, with no evidence of sclerosis or stenosis. 10. The pulmonic valve was normal in structure. Pulmonic valve regurgitation is not visualized by color flow Doppler. 11. The inferior vena cava is normal in size with greater than 50% respiratory variability, suggesting right atrial pressure of 3 mmHg. 12. Left ventricular diastolic Doppler parameters are consistent with impaired relaxation pattern of LV diastolic filling. 13. TR signal is inadequate for assessing pulmonary artery systolic pressure. _____________   History of Present Illness     Ms. Debbie Sims is a 73 year old female with past medical history of hypertension and Alzheimer's disease.  According to her son, she was diagnosed with Alzheimer's disease at least 5 years ago and her memory has been progressively worse.  She is currently residing in [place] in [facility].  Her medications are given to her on a daily basis.  She denies any prior cardiac issue and has never seen a cardiologist.  Both of her parents had heart disease later in life.  Her mother had bypass surgery.  She says she was in her usual state of health until this morning when she had sudden onset of chest pain that lasted around 30 minutes.  She isn't able to give me much details due to pulmonary issue.  By the time she arrived in the [facility] emergency room, her chest pain has completely resolved.  Initial EKG  showed minimal ST elevation in V2 and V3 with questionable artifact versus second-degree type I heart block.  Repeat EKG obtained 2 hours later demonstrated worsening ST  elevation in V2 and V3.  She does not remember the current year, the location or the current president of [place].  Cardiology has been called for evaluation of NSTEMI.   Hospital Course     Consultants: none  NSTEMI Pt was admitted to cardiology. HS troponin 1700 --> 2000. In discussion with patient's son, given her dementia he does not think she would be able to cooperate with heart catheterization. Medical management was pursued. IV heparin for 48 hr. DAPT was planned. She was also started on imdur and BB. Echo with apical thrombus, as below, so ASA was discontinued in the setting of coumadin.    Hyperlipidemia 03/07/2019: Cholesterol 228; HDL 66; LDL Cholesterol 138; Triglycerides 122; VLDL 24 Started lipitor this admission.    Apical thrombus She underwent echocardiogram 03/06/19 which revealed EF of 35-40%, diastolic dysfunction, and apical thrombus. Lovenox bridge to coumadin was started. She has received one dose of 7.5 mg coumadin in the hospital. INR is 1.2.  In consultation with pharmacy, will discharge on 60 mg (0.6 ml) of lovenox x 6 syringes, with INR check Monday/Tues. Will discharge with 7.5 mg coumadin tonight followed by 5 mg coumadin until INR check.    Acute systolic and diastolic heart failure Hypertension Echo as above. Presumably due to anterior MI. She is euvolemic on exam. Counseled on low sodium diet and daily weights. May [facility].   Dementia She will discharge back to independent living.    Pt seen and examined by Dr. Purvis SheffieldKoneswaran and deemed stable for discharge. Follow up will be made.     Did the patient have an acute coronary syndrome (MI, NSTEMI, STEMI, etc) this admission?:  Yes                               AHA/ACC Clinical Performance & Quality Measures: 1. Aspirin prescribed? - No - coumadin 2. ADP Receptor Inhibitor (Plavix/Clopidogrel, Brilinta/Ticagrelor or Effient/Prasugrel) prescribed (includes  medically managed patients)? - Yes 3. Beta Blocker prescribed? - Yes 4. High Intensity Statin (Lipitor 40-80mg  or Crestor 20-40mg ) prescribed? - Yes 5. EF assessed during THIS hospitalization? - Yes 6. For EF <40%, was ACEI/ARB prescribed? - Yes 7. For EF <40%, Aldosterone Antagonist (Spironolactone or Eplerenone) prescribed? - No - Reason:  marginal pressur 8. Cardiac Rehab Phase II ordered (Included Medically managed Patients)? - Yes   _____________  Discharge Vitals Blood pressure (!) 135/93, pulse 71, temperature 99.4 F (37.4 C), temperature source Oral, resp. rate (!) 26, height 5' (1.524 m), weight 55.6 kg, SpO2 94 %.  Filed Weights   03/06/19 1034 03/08/19 0618  Weight: 54.4 kg 55.6 kg    Labs & Radiologic Studies    CBC Recent Labs    03/07/19 0249 03/08/19 0414  WBC 6.2 7.0  HGB 14.2 13.9  HCT 43.5 43.0  MCV 100.7* 99.3  PLT 184 192   Basic Metabolic Panel Recent Labs    16/02/9609/09/20 0249 03/08/19 0414  NA 140 138  K 4.2 3.8  CL 108 106  CO2 21* 21*  GLUCOSE 120* 109*  BUN 16 11  CREATININE 0.82 0.80  CALCIUM 9.0 9.2   Liver Function Tests Recent Labs    03/08/19 0414  AST 23  ALT 14  ALKPHOS 60  BILITOT 0.7  PROT 6.6  ALBUMIN 3.5   No results for input(s): LIPASE, AMYLASE in the last 72 hours. High Sensitivity Troponin:   Recent Labs  Lab 03/06/19 1047 03/06/19 1242 03/06/19 1917  TROPONINIHS 1,775* 2,045* 4,378*    BNP Invalid input(s): POCBNP D-Dimer No results for input(s): DDIMER in the last 72 hours. Hemoglobin A1C Recent Labs    03/07/19 0249  HGBA1C 5.6   Fasting Lipid Panel Recent Labs    03/07/19 0249  CHOL 228*  HDL 66  LDLCALC 138*  TRIG 122  CHOLHDL 3.5   Thyroid Function Tests No results for input(s): TSH, T4TOTAL, T3FREE, THYROIDAB in the last 72 hours.  Invalid input(s): FREET3 _____________  Dg Chest 2 View  Result Date: 03/06/2019 CLINICAL DATA:  Chest pain.  Question fall EXAM: CHEST - 2 VIEW  COMPARISON:  08/03/2011 FINDINGS: Lungs are clear and well aerated. No infiltrate or effusion. Negative for heart failure Deformity of the proximal left humerus most consistent with chronic fracture IMPRESSION: No acute abnormality. Electronically Signed   By: Marlan Palau M.D.   On: 03/06/2019 11:27   Disposition   Pt is being discharged home today in good condition.  Follow-up Plans & Appointments    Follow-up Information    Abelino Derrick, PA-C Follow up.   Specialties: Cardiology, Radiology Why: [facility] location - see appointment information listed below for 03/19/19 at 11am. Franky Macho is one of the PAs that works closely with Dr.  Salvia and our cardiology team. Contact information: [address] [place] [address] [phone]        [facility] Follow up on 03/10/2019.   Specialty: Cardiology Why: Call office for INR check on Monday Contact information: [address] [place] [address] [phone]         Discharge Instructions    Diet - low sodium heart healthy   Complete by: As directed    Increase activity slowly   Complete by: As directed       Discharge Medications   Allergies as of 03/08/2019      Reactions   Amoxicillin Other (See Comments)   Has patient had a PCN reaction causing immediate rash, facial/tongue/throat swelling, SOB or lightheadedness with hypotension: No Has patient had a PCN reaction causing severe rash involving mucus membranes or skin necrosis: No Has patient had a PCN reaction that required hospitalization No Has patient had a PCN reaction occurring within the last 10 years: No If all of the above answers are "NO", then may proceed with Cephalosporin use.   Trazodone Other (See Comments)   Erythromycin Rash      Medication List    TAKE these medications   atorvastatin 80 MG tablet Commonly known as: LIPITOR Take 1 tablet (80 mg total) by mouth daily  at 6 PM.   clopidogrel 75 MG tablet Commonly known as: PLAVIX Take 1 tablet (75 mg total) by mouth daily. Start taking on: March 09, 2019   enoxaparin 60 MG/0.6ML injection Commonly known as: LOVENOX Inject 0.6 mLs (60 mg total) into the skin every 12 (twelve) hours.   isosorbide mononitrate 30 MG 24 hr tablet Commonly known as: IMDUR Take 1 tablet (30 mg total) by mouth daily. Start taking on: March 09, 2019   Memantine HCl-Donepezil HCl 28-10 MG Cp24 Commonly known as: Namzaric Take 1 capsule by mouth daily. Please keep your pending appt. with Dolores Hoose, NP on 06/18/17, arrival time of 12:30pm.  What changed: additional instructions   metoprolol tartrate 25 MG tablet Commonly known as: LOPRESSOR Take 0.5 tablets (12.5 mg total) by mouth 2 (two) times daily.   nitroGLYCERIN 0.4 MG SL tablet Commonly known as: NITROSTAT Place 1 tablet (0.4 mg total) under the tongue every 5 (five) minutes x 3 doses as needed for chest pain.   quinapril 40 MG tablet Commonly known as: ACCUPRIL Take 40 mg by mouth daily.   sertraline 100 MG tablet Commonly known as: ZOLOFT Take 100 mg by mouth daily.   vitamin C 500 MG tablet Commonly known as: ASCORBIC ACID Take 1 tablet (500 mg total) by mouth daily.   VITAMIN D PO Take 1 tablet by mouth daily.   warfarin 5 MG tablet Commonly known as: COUMADIN Take 7.5 mg (1.5 tablets) once this evening. Then take 5 mg (1 tablet) nightly until INR check.          Outstanding Labs/Studies   INR check Mon/Tues morning  Duration of Discharge Encounter   Greater than 30 minutes including physician time.  Signed, Roe Rutherford Loleta Frommelt, PA 03/08/2019, 12:07 PM

## 2019-03-08 NOTE — TOC Progression Note (Signed)
Transition of Care [facility]) - Progression Note    Patient Details  Name: Debbie Sims MRN: [id number] Date of Birth: [birth year]  [facility] CM/SW Contact  Zenon Mayo, RN Phone Number: 03/08/2019, 10:27 AM  Clinical Narrative:    Patient will be on lovenox, NCM asked MD to send lovenox script to patient's pharmacy to see what the cost will be since can not do benefit check on Saturday.          Expected Discharge Plan and Services                                                 Social Determinants of Health (SDOH) Interventions    Readmission Risk Interventions No flowsheet data found.

## 2019-03-08 NOTE — Progress Notes (Signed)
Progress Note  Patient Name: Debbie Sims Date of Encounter: 03/08/2019  Primary Cardiologist: No primary care provider on file.   Subjective   Patient is doing well.  I spoke with her son, Thayer Ohm, as well who was in the room.  Patient denies chest pain, leg swelling, and shortness of breath.  Inpatient Medications    Scheduled Meds: . aspirin EC  81 mg Oral Daily  . atorvastatin  80 mg Oral q1800  . clopidogrel  75 mg Oral Daily  . enoxaparin (LOVENOX) injection  50 mg Subcutaneous Q12H  . isosorbide mononitrate  30 mg Oral Daily  . metoprolol tartrate  12.5 mg Oral BID  . quinapril  40 mg Oral Daily  . warfarin  7.5 mg Oral ONCE-1800  . Warfarin - Pharmacist Dosing Inpatient   Does not apply q1800   Continuous Infusions:  PRN Meds: acetaminophen, nitroGLYCERIN, ondansetron (ZOFRAN) IV   Vital Signs    Vitals:   03/07/19 2148 03/08/19 0339 03/08/19 0618 03/08/19 0828  BP: 120/80  116/84 (!) 134/92  Pulse: 67  71   Resp: 16 18 15 12   Temp: 98.7 F (37.1 C)  99.4 F (37.4 C)   TempSrc: Oral  Oral   SpO2: 97%  94%   Weight:   55.6 kg   Height:        Intake/Output Summary (Last 24 hours) at 03/08/2019 1045 Last data filed at 03/07/2019 1503 Gross per 24 hour  Intake 384.32 ml  Output -  Net 384.32 ml   Filed Weights   03/06/19 1034 03/08/19 0618  Weight: 54.4 kg 55.6 kg    Telemetry    Sinus rhythm- Personally Reviewed  ECG    Sinus rhythm with nonspecific ST segment and T wave abnormalities- Personally Reviewed  Physical Exam   GEN: No acute distress.   Neck: No JVD Cardiac: RRR, no murmurs, rubs, or gallops.  Respiratory: Clear to auscultation bilaterally. GI: Soft, nontender, non-distended  MS: No edema; No deformity. Neuro:  Nonfocal  Psych: Normal affect.  Confused.  Labs    Chemistry Recent Labs  Lab 03/06/19 1047 03/07/19 0249 03/08/19 0414  NA 139 140 138  K 3.9 4.2 3.8  CL 108 108 106  CO2 21* 21* 21*  GLUCOSE 115*  120* 109*  BUN 16 16 11   CREATININE 1.04* 0.82 0.80  CALCIUM 8.6* 9.0 9.2  PROT  --   --  6.6  ALBUMIN  --   --  3.5  AST  --   --  23  ALT  --   --  14  ALKPHOS  --   --  60  BILITOT  --   --  0.7  GFRNONAA 54* >60 >60  GFRAA >60 >60 >60  ANIONGAP 10 11 11      Hematology Recent Labs  Lab 03/06/19 1047 03/07/19 0249 03/08/19 0414  WBC 7.5 6.2 7.0  RBC 4.67 4.32 4.33  HGB 15.3* 14.2 13.9  HCT 45.9 43.5 43.0  MCV 98.3 100.7* 99.3  MCH 32.8 32.9 32.1  MCHC 33.3 32.6 32.3  RDW 13.4 13.4 13.2  PLT 203 184 192    Cardiac EnzymesNo results for input(s): TROPONINI in the last 168 hours. No results for input(s): TROPIPOC in the last 168 hours.   BNPNo results for input(s): BNP, PROBNP in the last 168 hours.   DDimer No results for input(s): DDIMER in the last 168 hours.   Radiology    Dg Chest 2 View  Result Date: 03/06/2019 CLINICAL DATA:  Chest pain.  Question fall EXAM: CHEST - 2 VIEW COMPARISON:  08/03/2011 FINDINGS: Lungs are clear and well aerated. No infiltrate or effusion. Negative for heart failure Deformity of the proximal left humerus most consistent with chronic fracture IMPRESSION: No acute abnormality. Electronically Signed   By: Franchot Gallo M.D.   On: 03/06/2019 11:27    Cardiac Studies   Echo 03/06/19: IMPRESSIONS   1. Left ventricular ejection fraction, by visual estimation, is 35-40%. The left ventricle has moderately reduced LVF. Normal left ventricular size. There is moderately increased left ventricular hypertrophy of the basal septum. 2. Definity contrast agent was given IV to delineate the left ventricular endocardial borders. 3. There is akinesis of the apical, apical anterior, basal and mid antero and inferoseptum, apical septal, apical inferior walls with LV apical mural thrombus. 4. Global right ventricle has normal systolic function.The right ventricular size is normal. No increase in right ventricular wall thickness. 5. Left atrial  size was normal. 6. Right atrial size was normal. 7. The mitral valve is normal in structure. No evidence of mitral valve regurgitation. No evidence of mitral stenosis. 8. The tricuspid valve is normal in structure. Tricuspid valve regurgitation was not visualized by color flow Doppler. 9. The aortic valve is normal in structure. Aortic valve regurgitation was not visualized by color flow Doppler. Structurally normal aortic valve, with no evidence of sclerosis or stenosis. 10. The pulmonic valve was normal in structure. Pulmonic valve regurgitation is not visualized by color flow Doppler. 11. The inferior vena cava is normal in size with greater than 50% respiratory variability, suggesting right atrial pressure of 3 mmHg. 12. Left ventricular diastolic Doppler parameters are consistent with impaired relaxation pattern of LV diastolic filling. 13. TR signal is inadequate for assessing pulmonary artery systolic pressure.  Patient Profile     73 y.o. female with advanced Alzheimer's dementia and hypertension admitted with NSTEMI.  She was found to have an anterior MI with reduced systolic function and LV thrombus.  Medically managed due to dementia.  Assessment & Plan    # NSTEMI:  Medically managed due to dementia.  Continue clopidogrel, Imdur and metoprolol. LDL 138 this admission. Continue atorvastatin, which was started this admission.  I will stop aspirin as she is on warfarin as well.  # Acute systolic/diastolic heart failure: LVEF 35-40%. Presumably due to anterior MI.  She has no volume overload on exam.  Will need to weigh daily at discharge.    Currently on metoprolol tartrate and quinapril.  Quinapril could be transition to [facility] in the outpatient setting.  # Apical thrombus: She is currently on  enoxaparin and warfarin. INR goal 2-3.  Would repeat echo in a few months to reassess.  # Hypertension:  Currently on quinapril.  This could be transition to [facility] in the  outpatient setting.  # Dementia: She will receive care from her son, her son's aunts, and visiting nurse aides while in assisted living.  #Hyperlipidemia:  Continue atorvastatin, which was started this admission.  Disposition: Discharge back to assisted living today.  For questions or updates, please contact [place] Please consult [email or web address] for contact info under Cardiology/STEMI.      Signed, Kate Sable, MD  03/08/2019, 10:45 AM

## 2019-03-08 NOTE — Discharge Instructions (Signed)

## 2019-03-08 NOTE — Plan of Care (Signed)
  Problem: Education: Goal: Knowledge of General Education information will improve Description: Including pain rating scale, medication(s)/side effects and non-pharmacologic comfort measures 03/08/2019 1224 by Zenovia Jarred, RN Outcome: Adequate for Discharge 03/08/2019 1223 by Zenovia Jarred, RN Outcome: Progressing 03/08/2019 1108 by Zenovia Jarred, RN Outcome: Progressing   Problem: Health Behavior/Discharge Planning: Goal: Ability to manage health-related needs will improve 03/08/2019 1224 by Zenovia Jarred, RN Outcome: Adequate for Discharge 03/08/2019 1223 by Zenovia Jarred, RN Outcome: Progressing 03/08/2019 1108 by Zenovia Jarred, RN Outcome: Progressing   Problem: Clinical Measurements: Goal: Ability to maintain clinical measurements within normal limits will improve Outcome: Adequate for Discharge Goal: Will remain free from infection 03/08/2019 1224 by Zenovia Jarred, RN Outcome: Adequate for Discharge 03/08/2019 1108 by Zenovia Jarred, RN Outcome: Progressing Goal: Diagnostic test results will improve Outcome: Adequate for Discharge Goal: Respiratory complications will improve Outcome: Adequate for Discharge Goal: Cardiovascular complication will be avoided Outcome: Adequate for Discharge   Problem: Activity: Goal: Risk for activity intolerance will decrease Outcome: Adequate for Discharge   Problem: Nutrition: Goal: Adequate nutrition will be maintained Outcome: Adequate for Discharge   Problem: Coping: Goal: Level of anxiety will decrease Outcome: Adequate for Discharge   Problem: Elimination: Goal: Will not experience complications related to bowel motility Outcome: Adequate for Discharge Goal: Will not experience complications related to urinary retention Outcome: Adequate for Discharge   Problem: Pain Managment: Goal: General experience of comfort will improve Outcome: Adequate for Discharge   Problem: Safety: Goal: Ability to remain  free from injury will improve Outcome: Adequate for Discharge   Problem: Skin Integrity: Goal: Risk for impaired skin integrity will decrease Outcome: Adequate for Discharge

## 2019-03-10 ENCOUNTER — Telehealth (INDEPENDENT_AMBULATORY_CARE_PROVIDER_SITE_OTHER): Payer: Medicare Other

## 2019-03-10 ENCOUNTER — Other Ambulatory Visit: Payer: Self-pay

## 2019-03-10 ENCOUNTER — Ambulatory Visit (INDEPENDENT_AMBULATORY_CARE_PROVIDER_SITE_OTHER): Payer: Medicare Other | Admitting: Pharmacist Clinician (PhC)/ Clinical Pharmacy Specialist

## 2019-03-10 DIAGNOSIS — Z7901 Long term (current) use of anticoagulants: Secondary | ICD-10-CM

## 2019-03-10 DIAGNOSIS — I513 Intracardiac thrombosis, not elsewhere classified: Secondary | ICD-10-CM

## 2019-03-10 LAB — POCT INR: INR: 6.9 — AB (ref 2.0–3.0)

## 2019-03-10 NOTE — Telephone Encounter (Signed)
-----   Message from Ledora Bottcher, [place] sent at 03/08/2019 11:42 AM EDT ----- Pt is discharging today and will need INR check Mon/Tues. I gave her 6 syringes of lovenox.   Thanks Angie

## 2019-03-10 NOTE — Telephone Encounter (Signed)
Patient contacted regarding discharge from [facility] on 03/08/2019.  Patient understands to follow up with provider [facility] on 03/19/19 at [place] at [facility]. Patient understands discharge instructions? YES Patient understands medications and regiment? YES Patient understands to bring all medications to this visit? YES

## 2019-03-11 MED ORDER — METOPROLOL SUCCINATE ER 25 MG PO TB24: 25.0000 mg | ORAL_TABLET | Freq: Every day | ORAL | 6 refills | Status: DC

## 2019-03-12 ENCOUNTER — Ambulatory Visit (INDEPENDENT_AMBULATORY_CARE_PROVIDER_SITE_OTHER): Payer: Medicare Other | Admitting: Pharmacist

## 2019-03-12 ENCOUNTER — Other Ambulatory Visit: Payer: Self-pay

## 2019-03-12 DIAGNOSIS — I513 Intracardiac thrombosis, not elsewhere classified: Secondary | ICD-10-CM | POA: Diagnosis not present

## 2019-03-12 DIAGNOSIS — Z7901 Long term (current) use of anticoagulants: Secondary | ICD-10-CM

## 2019-03-12 LAB — POCT INR: INR: 6.1 — AB (ref 2.0–3.0)

## 2019-03-12 MED ORDER — ATORVASTATIN CALCIUM 80 MG PO TABS: 80.0000 mg | ORAL_TABLET | Freq: Every day | ORAL | 3 refills | Status: AC

## 2019-03-19 ENCOUNTER — Ambulatory Visit: Payer: Medicare Other | Admitting: Cardiology

## 2019-03-19 ENCOUNTER — Other Ambulatory Visit: Payer: Self-pay

## 2019-03-19 ENCOUNTER — Ambulatory Visit (INDEPENDENT_AMBULATORY_CARE_PROVIDER_SITE_OTHER): Payer: Medicare Other | Admitting: Pharmacist Clinician (PhC)/ Clinical Pharmacy Specialist

## 2019-03-19 ENCOUNTER — Encounter: Payer: Self-pay | Admitting: Cardiology

## 2019-03-19 VITALS — BP 116/76 | HR 66 | Temp 97.7°F | Ht 62.5 in | Wt 127.8 lb

## 2019-03-19 DIAGNOSIS — I513 Intracardiac thrombosis, not elsewhere classified: Secondary | ICD-10-CM

## 2019-03-19 DIAGNOSIS — Z7901 Long term (current) use of anticoagulants: Secondary | ICD-10-CM | POA: Diagnosis not present

## 2019-03-19 DIAGNOSIS — G300 Alzheimer's disease with early onset: Secondary | ICD-10-CM | POA: Diagnosis not present

## 2019-03-19 DIAGNOSIS — F0280 Dementia in other diseases classified elsewhere without behavioral disturbance: Secondary | ICD-10-CM

## 2019-03-19 DIAGNOSIS — E785 Hyperlipidemia, unspecified: Secondary | ICD-10-CM

## 2019-03-19 LAB — POCT INR: INR: 1.6 — AB (ref 2.0–3.0)

## 2019-03-19 NOTE — Progress Notes (Signed)
Cardiology Office Note:    Date:  03/19/2019   ID:  Debbie Sims, DOB 08/04/1945, MRN [id number]  PCP:  Kathyrn Lass, MD  Cardiologist:  Skeet Latch, MD  Electrophysiologist:  None   Referring MD: Kathyrn Lass, MD   No chief complaint on file. Post hospital follow up  History of Present Illness:    Debbie Sims is a 73 y.o. female with a hx of hypertension and Alzheimer's disease. According to her son, she was diagnosed with Alzheimer's disease at least 5 years ago and her memory has been progressively worse.  She is currently residing in [facility].  Her medications are given to her on a daily basis.  She denies any prior cardiac issue and has never seen a cardiologist.  She presented 03/06/2019 with a NSTEMI.  Her troponin peaked at 4378.  Echo showed an EF of 35-40% with apical thrombus. It was decided to treat her medically secondary to dementia. She was discharged on Plavix and Coumadin as well as ACE, statin, and beta blocker.  She is in the office today for follow up accompanied by her son. The patient has done well since discharge- no chest pain or dyspnea.   Past Medical History:  Diagnosis Date  . Acute combined systolic and diastolic heart failure ([place]) 02/2019  . Alzheimer's disease ([place]) 07/21/2015   Moderate severity  . Complication of anesthesia    slow to wake up  . Depression   . Hypertension   . 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 Memory difficulty 07/20/2015  . Myocardial infarction ([place]) 02/2019  . Vitamin B12 deficiency     Past Surgical History:  Procedure Laterality Date  . APPENDECTOMY    . ORIF ELBOW FRACTURE Right 11/27/2013   Procedure: OPEN REDUCTION INTERNAL FIXATION (ORIF) ELBOW/OLECRANON FRACTURE and Radial head replacement;  Surgeon: [name], MD;  Location: [facility];  Service: Orthopedics;  Laterality: Right;    Current Medications: Current Meds  Medication Sig  . atorvastatin (LIPITOR) 80 MG tablet  Take 1 tablet (80 mg total) by mouth daily.  . Cholecalciferol (VITAMIN D PO) Take 1 tablet by mouth daily.   . clopidogrel (PLAVIX) 75 MG tablet Take 1 tablet (75 mg total) by mouth daily.  . isosorbide mononitrate (IMDUR) 30 MG 24 hr tablet Take 1 tablet (30 mg total) by mouth daily.  . Memantine HCl-Donepezil HCl (NAMZARIC) 28-10 MG CP24 Take 1 capsule by mouth daily. Please keep your pending appt. with [name], NP on 06/18/17, arrival time of 12:30pm. (Patient taking differently: Take 1 capsule by mouth daily. )  . metoprolol succinate (TOPROL XL) 25 MG 24 hr tablet Take 1 tablet (25 mg total) by mouth daily.  . nitroGLYCERIN (NITROSTAT) 0.4 MG SL tablet Place 1 tablet (0.4 mg total) under the tongue every 5 (five) minutes x 3 doses as needed for chest pain.  Marland Kitchen quinapril (ACCUPRIL) 40 MG tablet Take 40 mg by mouth daily.  . sertraline (ZOLOFT) 100 MG tablet Take 100 mg by mouth daily.  . vitamin C (ASCORBIC ACID) 500 MG tablet Take 1 tablet (500 mg total) by mouth daily.  Marland Kitchen warfarin (COUMADIN) 5 MG tablet Take 7.5 mg (1.5 tablets) once this evening. Then take 5 mg (1 tablet) nightly until INR check.     Allergies:   Amoxicillin, Trazodone, and Erythromycin   Social History   Socioeconomic History  . Marital status: Single    Spouse name: Not on file  .  Number of children: 1  . Years of education: 12+  . Highest education level: Not on file  Occupational History  . Occupation: Retired  Engineer, production  . Financial resource strain: Not on file  . Food insecurity    Worry: Not on file    Inability: Not on file  . Transportation needs    Medical: Not on file    Non-medical: Not on file  Tobacco Use  . Smoking status: Never Smoker  . Smokeless tobacco: Never Used  Substance and Sexual Activity  . Alcohol use: Yes    Alcohol/week: 2.0 standard drinks    Types: 2 Standard drinks or equivalent per week    Comment: rare  . Drug use: No  . Sexual activity: Not on file  Lifestyle   . Physical activity    Days per week: Not on file    Minutes per session: Not on file  . Stress: Not on file  Relationships  . Social Musician on phone: Not on file    Gets together: Not on file    Attends religious service: Not on file    Active member of club or organization: Not on file    Attends meetings of clubs or organizations: Not on file    Relationship status: Not on file  Other Topics Concern  . Not on file  Social History Narrative   Patient lives at home alone    Patient is retired.   Patient has a Chief Operating Officer.    Patient has 1 child.    Patient is single       1 cup of coffee daily   Right handed     Family History: The patient's family history includes Arthritis in her mother; Dementia in her father, maternal aunt, and maternal uncle; Heart disease in her father; Kidney disease in her father; Prostate cancer in her father; Stroke in her mother.  ROS:   Please see the history of present illness.     All other systems reviewed and are negative.  EKGs/Labs/Other Studies Reviewed:    The following studies were reviewed today: Echo Oct 2020  EKG:  EKG is ordered today.  The ekg ordered today demonstrates NSR, lateral TWI, HR 62  Recent Labs: 03/08/2019: ALT 14; BUN 11; Creatinine, Ser 0.80; Hemoglobin 13.9; Platelets 192; Potassium 3.8; Sodium 138  Recent Lipid Panel    Component Value Date/Time   CHOL 228 (H) 03/07/2019 0249   TRIG 122 03/07/2019 0249   HDL 66 03/07/2019 0249   CHOLHDL 3.5 03/07/2019 0249   VLDL 24 03/07/2019 0249   LDLCALC 138 (H) 03/07/2019 0249    Physical Exam:    VS:  BP 116/76   Pulse 66   Temp 97.7 F (36.5 C)   Ht 5' 2.5" (1.588 m)   Wt 127 lb 12.8 oz (58 kg)   SpO2 96%   BMI 23.00 kg/m     Wt Readings from Last 3 Encounters:  03/19/19 127 lb 12.8 oz (58 kg)  03/08/19 122 lb 8 oz (55.6 kg)  07/13/17 114 lb (51.7 kg)     GEN:  Well nourished, well developed in no acute distress HEENT: Normal NECK:  No JVD; No carotid bruits LYMPHATICS: No lymphadenopathy CARDIAC: RRR, no murmurs, rubs, gallops RESPIRATORY:  Clear to auscultation without rales, wheezing or rhonchi  ABDOMEN: Soft, non-tender, non-distended MUSCULOSKELETAL:  No edema; No deformity  SKIN: Warm and dry NEUROLOGIC:  Alert and oriented x 3 PSYCHIATRIC:  Normal affect   ASSESSMENT:    NSTEMI (non-ST elevated myocardial infarction) (HCC) 03/06/2019 with NSTEMI- troponin pk 2000.  No cath secondary to dementia  Left ventricular apical thrombus Coumadin added for apical thrombus  Alzheimer's disease (HCC) Moderate severity  Dyslipidemia, goal LDL below 70 Statin added Oct 2020  PLAN:    I discussed Entresto with the pt's son but he says a BID medication "wil not work for her" secondary to her dementia.  For now same Rx- check BMP today on ACE and f/u with Dr Duke Salvia in 2-3 months.    Medication Adjustments/Labs and Tests Ordered: Current medicines are reviewed at length with the patient today.  Concerns regarding medicines are outlined above.  No orders of the defined types were placed in this encounter.  No orders of the defined types were placed in this encounter.   There are no Patient Instructions on file for this visit.   Jolene Provost, PA-C  03/19/2019 11:34 AM    [facility]

## 2019-03-19 NOTE — Assessment & Plan Note (Signed)
03/06/2019 with NSTEMI- troponin pk 2000.  No cath secondary to dementia

## 2019-03-19 NOTE — Patient Instructions (Signed)
Medication Instructions:  Your physician recommends that you continue on your current medications as directed. Please refer to the Current Medication list given to you today. *If you need a refill on your cardiac medications before your next appointment, please call your pharmacy*  Lab Work: Your physician recommends that you return for lab work in: TODAY-BMET If you have labs (blood work) drawn today and your tests are completely normal, you will receive your results only by: Marland Kitchen MyChart Message (if you have MyChart) OR . A paper copy in the mail If you have any lab test that is abnormal or we need to change your treatment, we will call you to review the results.  Testing/Procedures: NONE   Follow-Up: At [facility], you and your health needs are our priority.  As part of our continuing mission to provide you with exceptional heart care, we have created designated Provider Care Teams.  These Care Teams include your primary Cardiologist (physician) and Advanced Practice Providers (APPs -  Physician Assistants and Nurse Practitioners) who all work together to provide you with the care you need, when you need it.  Your next appointment:   3 months  The format for your next appointment:   In Person  Provider:   Skeet Latch, MD  Other Instructions

## 2019-03-19 NOTE — Assessment & Plan Note (Signed)
Moderate severity

## 2019-03-19 NOTE — Assessment & Plan Note (Signed)
Coumadin added for apical thrombus

## 2019-03-19 NOTE — Assessment & Plan Note (Signed)
Statin added Oct 2020

## 2019-03-19 NOTE — Patient Instructions (Signed)
Take extra 1/2 tablet today Wednesday Oct 21,  then take 1/2 tablet daily except 1 tablet each Wednesday and Sunday.  Repeat INR in 1 week

## 2019-03-20 LAB — BASIC METABOLIC PANEL
BUN/Creatinine Ratio: 17 (ref 12–28)
BUN: 16 mg/dL (ref 8–27)
CO2: 24 mmol/L (ref 20–29)
Calcium: 9.3 mg/dL (ref 8.7–10.3)
Chloride: 108 mmol/L — ABNORMAL HIGH (ref 96–106)
Creatinine, Ser: 0.94 mg/dL (ref 0.57–1.00)
GFR calc Af Amer: 71 mL/min/{1.73_m2} (ref >59)
GFR calc non Af Amer: 61 mL/min/{1.73_m2} (ref >59)
Glucose: 78 mg/dL (ref 65–99)
Potassium: 4.7 mmol/L (ref 3.5–5.2)
Sodium: 144 mmol/L (ref 134–144)

## 2019-03-26 ENCOUNTER — Ambulatory Visit (INDEPENDENT_AMBULATORY_CARE_PROVIDER_SITE_OTHER): Payer: Medicare Other | Admitting: Cardiology

## 2019-03-26 DIAGNOSIS — Z7901 Long term (current) use of anticoagulants: Secondary | ICD-10-CM

## 2019-03-26 DIAGNOSIS — I513 Intracardiac thrombosis, not elsewhere classified: Secondary | ICD-10-CM

## 2019-03-26 LAB — POCT INR: INR: 3.4 — AB (ref 2.0–3.0)

## 2019-04-02 ENCOUNTER — Ambulatory Visit (INDEPENDENT_AMBULATORY_CARE_PROVIDER_SITE_OTHER): Payer: Medicare Other | Admitting: Pharmacist

## 2019-04-02 DIAGNOSIS — I513 Intracardiac thrombosis, not elsewhere classified: Secondary | ICD-10-CM

## 2019-04-02 DIAGNOSIS — Z7901 Long term (current) use of anticoagulants: Secondary | ICD-10-CM

## 2019-04-02 LAB — POCT INR: INR: 8 — AB (ref 2.0–3.0)

## 2019-04-04 ENCOUNTER — Ambulatory Visit (INDEPENDENT_AMBULATORY_CARE_PROVIDER_SITE_OTHER): Payer: Medicare Other | Admitting: Pharmacist Clinician (PhC)/ Clinical Pharmacy Specialist

## 2019-04-04 DIAGNOSIS — Z7901 Long term (current) use of anticoagulants: Secondary | ICD-10-CM | POA: Diagnosis not present

## 2019-04-04 DIAGNOSIS — I513 Intracardiac thrombosis, not elsewhere classified: Secondary | ICD-10-CM

## 2019-04-04 LAB — POCT INR: INR: 8 — AB (ref 2.0–3.0)

## 2019-04-07 ENCOUNTER — Ambulatory Visit (INDEPENDENT_AMBULATORY_CARE_PROVIDER_SITE_OTHER): Payer: Medicare Other | Admitting: Cardiology

## 2019-04-07 ENCOUNTER — Other Ambulatory Visit: Payer: Self-pay | Admitting: Pharmacist Clinician (PhC)/ Clinical Pharmacy Specialist

## 2019-04-07 DIAGNOSIS — Z7901 Long term (current) use of anticoagulants: Secondary | ICD-10-CM | POA: Diagnosis not present

## 2019-04-07 DIAGNOSIS — I513 Intracardiac thrombosis, not elsewhere classified: Secondary | ICD-10-CM | POA: Diagnosis not present

## 2019-04-07 LAB — POCT INR: INR: 3 (ref 2.0–3.0)

## 2019-04-07 MED ORDER — WARFARIN SODIUM 2.5 MG PO TABS: 2.5000 mg | ORAL_TABLET | Freq: Every day | ORAL | 0 refills | Status: DC

## 2019-04-09 ENCOUNTER — Telehealth: Payer: Self-pay | Admitting: Cardiovascular Disease

## 2019-04-09 NOTE — Telephone Encounter (Signed)
New Message     Pts sister is calling about the pts INR and is wondering what she needs to do for  medication     Please call

## 2019-04-09 NOTE — Telephone Encounter (Signed)
Spoke with sister, will continue with 2.5 mg daily until INR draw tomorrow.

## 2019-04-10 ENCOUNTER — Ambulatory Visit (INDEPENDENT_AMBULATORY_CARE_PROVIDER_SITE_OTHER): Payer: Medicare Other | Admitting: Cardiovascular Disease

## 2019-04-10 DIAGNOSIS — Z7901 Long term (current) use of anticoagulants: Secondary | ICD-10-CM

## 2019-04-10 DIAGNOSIS — I513 Intracardiac thrombosis, not elsewhere classified: Secondary | ICD-10-CM

## 2019-04-10 LAB — POCT INR: INR: 2.8 (ref 2.0–3.0)

## 2019-04-17 ENCOUNTER — Ambulatory Visit (INDEPENDENT_AMBULATORY_CARE_PROVIDER_SITE_OTHER): Payer: Medicare Other | Admitting: Cardiology

## 2019-04-17 DIAGNOSIS — Z7901 Long term (current) use of anticoagulants: Secondary | ICD-10-CM

## 2019-04-17 DIAGNOSIS — I513 Intracardiac thrombosis, not elsewhere classified: Secondary | ICD-10-CM

## 2019-04-17 LAB — POCT INR: INR: 3.6 — AB (ref 2.0–3.0)

## 2019-04-23 ENCOUNTER — Ambulatory Visit (INDEPENDENT_AMBULATORY_CARE_PROVIDER_SITE_OTHER): Payer: Medicare Other | Admitting: Cardiovascular Disease

## 2019-04-23 DIAGNOSIS — I513 Intracardiac thrombosis, not elsewhere classified: Secondary | ICD-10-CM | POA: Diagnosis not present

## 2019-04-23 DIAGNOSIS — Z7901 Long term (current) use of anticoagulants: Secondary | ICD-10-CM

## 2019-04-23 LAB — POCT INR: INR: 2 (ref 2.0–3.0)

## 2019-04-30 ENCOUNTER — Ambulatory Visit (INDEPENDENT_AMBULATORY_CARE_PROVIDER_SITE_OTHER): Payer: Medicare Other | Admitting: Cardiovascular Disease

## 2019-04-30 DIAGNOSIS — I513 Intracardiac thrombosis, not elsewhere classified: Secondary | ICD-10-CM | POA: Diagnosis not present

## 2019-04-30 DIAGNOSIS — Z7901 Long term (current) use of anticoagulants: Secondary | ICD-10-CM

## 2019-04-30 LAB — POCT INR: INR: 2.6 (ref 2.0–3.0)

## 2019-05-07 ENCOUNTER — Ambulatory Visit (INDEPENDENT_AMBULATORY_CARE_PROVIDER_SITE_OTHER): Payer: Medicare Other | Admitting: Pharmacist Clinician (PhC)/ Clinical Pharmacy Specialist

## 2019-05-07 DIAGNOSIS — Z7901 Long term (current) use of anticoagulants: Secondary | ICD-10-CM

## 2019-05-07 DIAGNOSIS — I513 Intracardiac thrombosis, not elsewhere classified: Secondary | ICD-10-CM

## 2019-05-07 LAB — POCT INR: INR: 4.1 — AB (ref 2.0–3.0)

## 2019-05-14 ENCOUNTER — Ambulatory Visit (INDEPENDENT_AMBULATORY_CARE_PROVIDER_SITE_OTHER): Payer: Medicare Other | Admitting: Cardiology

## 2019-05-14 DIAGNOSIS — I513 Intracardiac thrombosis, not elsewhere classified: Secondary | ICD-10-CM | POA: Diagnosis not present

## 2019-05-14 DIAGNOSIS — Z7901 Long term (current) use of anticoagulants: Secondary | ICD-10-CM

## 2019-05-14 LAB — POCT INR: INR: 2.8 (ref 2.0–3.0)

## 2019-05-21 ENCOUNTER — Other Ambulatory Visit: Payer: Self-pay

## 2019-05-21 ENCOUNTER — Ambulatory Visit (INDEPENDENT_AMBULATORY_CARE_PROVIDER_SITE_OTHER): Payer: Medicare Other | Admitting: Pharmacist Clinician (PhC)/ Clinical Pharmacy Specialist

## 2019-05-21 DIAGNOSIS — Z7901 Long term (current) use of anticoagulants: Secondary | ICD-10-CM

## 2019-05-21 DIAGNOSIS — I513 Intracardiac thrombosis, not elsewhere classified: Secondary | ICD-10-CM | POA: Diagnosis not present

## 2019-05-21 LAB — POCT INR: INR: 2.4 (ref 2.0–3.0)

## 2019-05-21 NOTE — Patient Instructions (Signed)
Continue with  1/2 tablet daily except 1 tablet each Sunday and Thursday.  Repeat INR in 2 week

## 2019-06-06 ENCOUNTER — Ambulatory Visit (INDEPENDENT_AMBULATORY_CARE_PROVIDER_SITE_OTHER): Payer: Medicare PPO | Admitting: Pharmacist

## 2019-06-06 ENCOUNTER — Other Ambulatory Visit: Payer: Self-pay

## 2019-06-06 DIAGNOSIS — Z7901 Long term (current) use of anticoagulants: Secondary | ICD-10-CM

## 2019-06-06 DIAGNOSIS — I513 Intracardiac thrombosis, not elsewhere classified: Secondary | ICD-10-CM

## 2019-06-06 LAB — POCT INR: INR: 3 (ref 2.0–3.0)

## 2019-06-06 MED ORDER — WARFARIN SODIUM 2.5 MG PO TABS: ORAL_TABLET | ORAL | 0 refills | Status: DC

## 2019-06-06 NOTE — Patient Instructions (Signed)
Continue with  1/2 tablet daily except 1 tablet each Sunday and Thursday.  Repeat INR in 2 week - sees DR Duke Salvia at 1:40pm

## 2019-06-09 ENCOUNTER — Encounter: Payer: Self-pay | Admitting: *Deleted

## 2019-06-17 NOTE — Progress Notes (Signed)
Cardiology Clinic Note   Patient Name: Debbie Sims Date of Encounter: 06/19/2019  Primary Care Provider:  Kathyrn Lass, MD Primary Cardiologist:  Skeet Latch, MD  Patient Profile    Debbie Sims. [age] year old female presents today for follow-up evaluation of her essential hypertension, left ventricle apical thrombus, acute combined systolic and diastolic heart failure, and NSTEMI.  Past Medical History    Past Medical History:  Diagnosis Date  . Acute combined systolic and diastolic heart failure ([place]) 02/2019  . Alzheimer's disease ([place]) 07/21/2015   Moderate severity  . Complication of anesthesia    slow to wake up  . Depression   . Hypertension   . 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 Memory difficulty 07/20/2015  . Myocardial infarction ([place]) 02/2019  . Vitamin B12 deficiency    Past Surgical History:  Procedure Laterality Date  . APPENDECTOMY    . ORIF ELBOW FRACTURE Right 11/27/2013   Procedure: OPEN REDUCTION INTERNAL FIXATION (ORIF) ELBOW/OLECRANON FRACTURE and Radial head replacement;  Surgeon: [name], MD;  Location: [facility];  Service: Orthopedics;  Laterality: Right;    Allergies  Allergies  Allergen Reactions  . Amoxicillin Other (See Comments)    Has patient had a PCN reaction causing immediate rash, facial/tongue/throat swelling, SOB or lightheadedness with hypotension: No Has patient had a PCN reaction causing severe rash involving mucus membranes or skin necrosis: No Has patient had a PCN reaction that required hospitalization No Has patient had a PCN reaction occurring within the last 10 years: No If all of the above answers are "NO", then may proceed with Cephalosporin use.  . Trazodone Other (See Comments)  . Erythromycin Rash    History of Present Illness  Debbie Sims has a past medical history of hypertension and Alzheimer's.  She was diagnosed with Alzheimer's in 2015 and according to family has had progressive memory loss  since that time.  She is a resident at [facility].  She was diagnosed with NSTEMI 03/06/2019.  She had a peak troponin of 4378.  Her echocardiogram showed an EF of 35-40% with apical thrombus.  She was treated medically due to her dementia.  She was discharged home on Plavix and Coumadin as well as an ACE inhibitor, statin, and a beta-blocker.  She was last seen by [name] PA-C on 03/19/2019.  During that time she was doing well and denied chest discomfort or dyspnea.  She presents the clinic today with with her son and states she feels well.  Her son indicates that he would like her to have her INR checked at her residence.  I instructed him that I would follow-up on this with Dr. Oval Linsey  and our clinic pharmacist.  I will repeat her echocardiogram and have her follow-up with Dr. Oval Linsey after the echocardiogram has been completed.  She denies chest pain, shortness of breath, lower extremity edema, fatigue, palpitations, melena, hematuria, hemoptysis, diaphoresis, weakness, presyncope, syncope, orthopnea, and PND.   Home Medications    Prior to Admission medications   Medication Sig Start Date End Date Taking? Authorizing Provider  atorvastatin (LIPITOR) 80 MG tablet Take 1 tablet (80 mg total) by mouth daily. 03/12/19   Skeet Latch, MD  Cholecalciferol (VITAMIN D PO) Take 1 tablet by mouth daily.     [provider]  clopidogrel (PLAVIX) 75 MG tablet Take 1 tablet (75 mg total) by mouth daily. 03/09/19   Duke, Tami Lin, PA  isosorbide mononitrate (IMDUR) 30 MG 24  hr tablet Take 1 tablet (30 mg total) by mouth daily. 03/09/19   Duke, Roe Rutherford, PA  Memantine HCl-Donepezil HCl (NAMZARIC) 28-10 MG CP24 Take 1 capsule by mouth daily. Please keep your pending appt. with Dolores Hoose, NP on 06/18/17, arrival time of 12:30pm. Patient taking differently: Take 1 capsule by mouth daily.  03/04/18   York Spaniel, MD  metoprolol succinate (TOPROL XL) 25 MG 24  hr tablet Take 1 tablet (25 mg total) by mouth daily. 03/11/19   Chilton Si, MD  nitroGLYCERIN (NITROSTAT) 0.4 MG SL tablet Place 1 tablet (0.4 mg total) under the tongue every 5 (five) minutes x 3 doses as needed for chest pain. 03/08/19   Duke, Roe Rutherford, PA  quinapril (ACCUPRIL) 40 MG tablet Take 40 mg by mouth daily.    [provider]  sertraline (ZOLOFT) 100 MG tablet Take 100 mg by mouth daily.    [provider]  vitamin C (ASCORBIC ACID) 500 MG tablet Take 1 tablet (500 mg total) by mouth daily. 11/28/13   Bradly Bienenstock, MD  warfarin (COUMADIN) 2.5 MG tablet Take as directed by coumadin clinic 06/06/19   Chilton Si, MD    Family History    Family History  Problem Relation Age of Onset  . Prostate cancer Father   . Heart disease Father   . Kidney disease Father   . Dementia Father   . Arthritis Mother   . Stroke Mother   . Dementia Maternal Aunt   . Dementia Maternal Uncle    She indicated that her mother is alive. She indicated that her father is deceased. She indicated that both of her sisters are alive. She indicated that her maternal aunt is deceased. She indicated that her maternal uncle is deceased.  Social History    Social History   Socioeconomic History  . Marital status: Single    Spouse name: Not on file  . Number of children: 1  . Years of education: 12+  . Highest education level: Not on file  Occupational History  . Occupation: Retired  Tobacco Use  . Smoking status: Never Smoker  . Smokeless tobacco: Never Used  Substance and Sexual Activity  . Alcohol use: Yes    Alcohol/week: 2.0 standard drinks    Types: 2 Standard drinks or equivalent per week    Comment: rare  . Drug use: No  . Sexual activity: Not on file  Other Topics Concern  . Not on file  Social History Narrative   Patient lives at home alone    Patient is retired.   Patient has a Chief Operating Officer.    Patient has 1 child.    Patient is single       1 cup  of coffee daily   Right handed   Social Determinants of Health   Financial Resource Strain:   . Difficulty of Paying Living Expenses: Not on file  Food Insecurity:   . Worried About Programme researcher, broadcasting/film/video in the Last Year: Not on file  . Ran Out of Food in the Last Year: Not on file  Transportation Needs:   . Lack of Transportation (Medical): Not on file  . Lack of Transportation (Non-Medical): Not on file  Physical Activity:   . Days of Exercise per Week: Not on file  . Minutes of Exercise per Session: Not on file  Stress:   . Feeling of Stress : Not on file  Social Connections:   . Frequency of Communication with Friends  and Family: Not on file  . Frequency of Social Gatherings with Friends and Family: Not on file  . Attends Religious Services: Not on file  . Active Member of Clubs or Organizations: Not on file  . Attends Banker Meetings: Not on file  . Marital Status: Not on file  Intimate Partner Violence:   . Fear of Current or Ex-Partner: Not on file  . Emotionally Abused: Not on file  . Physically Abused: Not on file  . Sexually Abused: Not on file     Review of Systems    General:  No chills, fever, night sweats or weight changes.  Cardiovascular:  No chest pain, dyspnea on exertion, edema, orthopnea, palpitations, paroxysmal nocturnal dyspnea. Dermatological: No rash, lesions/masses Respiratory: No cough, dyspnea Urologic: No hematuria, dysuria Abdominal:   No nausea, vomiting, diarrhea, bright red blood per rectum, melena, or hematemesis Neurologic:  No visual changes, wkns, changes in mental status. All other systems reviewed and are otherwise negative except as noted above.  Physical Exam    VS:  BP 124/82 (BP Location: Left Arm, Patient Position: Sitting, Cuff Size: Normal)   Pulse (!) 57   Ht 5\' 2"  (1.575 m)   Wt 132 lb 6.4 oz (60.1 kg)   SpO2 97%   BMI 24.22 kg/m  , BMI Body mass index is 24.22 kg/m. GEN: Well nourished, well developed,  in no acute distress. HEENT: normal. Neck: Supple, no JVD, carotid bruits, or masses. Cardiac: RRR, no murmurs, rubs, or gallops. No clubbing, cyanosis, edema.  Radials/DP/PT 2+ and equal bilaterally.  Respiratory:  Respirations regular and unlabored, clear to auscultation bilaterally. GI: Soft, nontender, nondistended, BS + x 4. MS: no deformity or atrophy. Skin: warm and dry, no rash. Neuro:  Strength and sensation are intact. Psych: Normal affect.  Accessory Clinical Findings    ECG personally reviewed by me today-none today  EKG 03/19/2019 Normal sinus rhythm T wave abnormality consider anterolateral ischemia 63 bpm  Echocardiogram 03/06/2019 IMPRESSIONS    1. Left ventricular ejection fraction, by visual estimation, is 35-40%. The left ventricle has moderately reduced LVF. Normal left ventricular size. There is moderately increased left ventricular hypertrophy of the basal septum.  2. Definity contrast agent was given IV to delineate the left ventricular endocardial borders.  3. There is akinesis of the apical, apical anterior, basal and mid antero and inferoseptum, apical septal, apical inferior walls with LV apical mural thrombus.  4. Global right ventricle has normal systolic function.The right ventricular size is normal. No increase in right ventricular wall thickness.  5. Left atrial size was normal.  6. Right atrial size was normal.  7. The mitral valve is normal in structure. No evidence of mitral valve regurgitation. No evidence of mitral stenosis.  8. The tricuspid valve is normal in structure. Tricuspid valve regurgitation was not visualized by color flow Doppler.  9. The aortic valve is normal in structure. Aortic valve regurgitation was not visualized by color flow Doppler. Structurally normal aortic valve, with no evidence of sclerosis or stenosis. 10. The pulmonic valve was normal in structure. Pulmonic valve regurgitation is not visualized by color flow  Doppler. 11. The inferior vena cava is normal in size with greater than 50% respiratory variability, suggesting right atrial pressure of 3 mmHg. 12. Left ventricular diastolic Doppler parameters are consistent with impaired relaxation pattern of LV diastolic filling. 13. TR signal is inadequate for assessing pulmonary artery systolic pressure.     Assessment & Plan   1.  NSTEMI-03/06/2019 presented with NSTEMI.  Troponin peak 4378.  Echocardiogram showed EF 35 to 40% with apical thrombus.  No cardiac catheterization due to dementia Continue atorvastatin 80 mg tablet daily Continue isosorbide mononitrate 30 mg tablet daily Continue clopidogrel 75 mg tablet daily Continue metoprolol succinate 25 mg daily Continue nitroglycerin 0.4 mg sublingual as needed Heart healthy low-sodium diet Increase physical activity as tolerated  Left ventricular apical thrombus-identified 03/06/2019 on echocardiogram. Continue Coumadin as directed by Coumadin clinic--pharmacist will need to check off Ms. Artus's nurse for approval to do home INR checks. Repeat echocardiogram  Dyslipidemia-LDL goal below 70.  LDL 138 03/07/2019 Continue atorvastatin 80 mg tablet daily Repeat lipid panel and LFTs Heart healthy low-sodium high-fiber diet Increase physical activity as tolerated  Alzheimer's disease-initially diagnosed in 2015.  According to family has progressively gotten worse.  It is moderately severe at this time.   Disposition: Follow-up with Dr. Duke Salvia after echocardiogram.  Thomasene Ripple. Deveney Bayon NP-C     [facility] [address] Office [phone] Fax [phone]

## 2019-06-19 ENCOUNTER — Other Ambulatory Visit: Payer: Self-pay

## 2019-06-19 ENCOUNTER — Ambulatory Visit (INDEPENDENT_AMBULATORY_CARE_PROVIDER_SITE_OTHER): Payer: Medicare PPO | Admitting: Pharmacist

## 2019-06-19 ENCOUNTER — Encounter: Payer: Self-pay | Admitting: General Practice

## 2019-06-19 ENCOUNTER — Ambulatory Visit: Payer: Medicare Other | Admitting: Cardiovascular Disease

## 2019-06-19 ENCOUNTER — Ambulatory Visit: Payer: Medicare PPO | Admitting: General Practice

## 2019-06-19 VITALS — BP 124/82 | HR 57 | Ht 62.0 in | Wt 132.4 lb

## 2019-06-19 DIAGNOSIS — F0280 Dementia in other diseases classified elsewhere without behavioral disturbance: Secondary | ICD-10-CM

## 2019-06-19 DIAGNOSIS — Z7901 Long term (current) use of anticoagulants: Secondary | ICD-10-CM

## 2019-06-19 DIAGNOSIS — I214 Non-ST elevation (NSTEMI) myocardial infarction: Secondary | ICD-10-CM

## 2019-06-19 DIAGNOSIS — G300 Alzheimer's disease with early onset: Secondary | ICD-10-CM | POA: Diagnosis not present

## 2019-06-19 DIAGNOSIS — I513 Intracardiac thrombosis, not elsewhere classified: Secondary | ICD-10-CM

## 2019-06-19 DIAGNOSIS — E785 Hyperlipidemia, unspecified: Secondary | ICD-10-CM

## 2019-06-19 LAB — POCT INR: INR: 2.1 (ref 2.0–3.0)

## 2019-06-19 NOTE — Patient Instructions (Signed)
Testing/Procedures: Echocardiogram - Your physician has requested that you have an echocardiogram. Echocardiography is a painless test that uses sound waves to create images of your heart. It provides your doctor with information about the size and shape of your heart and how well your heart's chambers and valves are working. This procedure takes approximately one hour. There are no restrictions for this procedure. This will be performed at our [facility] location - [address], [address].  Reduce your risk of getting COVID-19 With your heart disease it is especially important for people at increased risk of severe illness from COVID-19, and those who live with them, to protect themselves from getting COVID-19. The best way to protect yourself and to help reduce the spread of the virus that causes COVID-19 is to: Marland Kitchen Limit your interactions with other people as much as possible. . Take precautions to prevent getting COVID-19 when you do interact with others. If you start feeling sick and think you may have COVID-19, get in touch with your healthcare provider within 24 hours.  Follow-Up: IN AFTER ECHO  In Person Chilton Si, MD.    At [facility], you and your health needs are our priority.  As part of our continuing mission to provide you with exceptional heart care, we have created designated Provider Care Teams.  These Care Teams include your primary Cardiologist (physician) and Advanced Practice Providers (APPs -  Physician Assistants and Nurse Practitioners) who all work together to provide you with the care you need, when you need it.  Thank you for choosing [facility] at [facility]!!

## 2019-07-04 ENCOUNTER — Ambulatory Visit (HOSPITAL_COMMUNITY): Payer: Medicare PPO | Attending: Cardiovascular Disease

## 2019-07-04 ENCOUNTER — Other Ambulatory Visit: Payer: Self-pay

## 2019-07-04 DIAGNOSIS — I513 Intracardiac thrombosis, not elsewhere classified: Secondary | ICD-10-CM | POA: Diagnosis not present

## 2019-07-04 MED ORDER — PERFLUTREN LIPID MICROSPHERE
1.0000 mL | INTRAVENOUS | Status: AC | PRN
  Administered 2019-07-04: 3 mL via INTRAVENOUS

## 2019-07-09 ENCOUNTER — Other Ambulatory Visit: Payer: Self-pay

## 2019-07-09 ENCOUNTER — Encounter: Payer: Self-pay | Admitting: Cardiovascular Disease

## 2019-07-09 ENCOUNTER — Ambulatory Visit (INDEPENDENT_AMBULATORY_CARE_PROVIDER_SITE_OTHER): Payer: Self-pay | Admitting: Pharmacist

## 2019-07-09 ENCOUNTER — Ambulatory Visit: Payer: Medicare PPO | Admitting: Cardiovascular Disease

## 2019-07-09 VITALS — BP 110/68 | HR 66 | Temp 97.5°F | Ht 62.0 in | Wt 131.0 lb

## 2019-07-09 DIAGNOSIS — I5041 Acute combined systolic (congestive) and diastolic (congestive) heart failure: Secondary | ICD-10-CM | POA: Diagnosis not present

## 2019-07-09 DIAGNOSIS — I5042 Chronic combined systolic (congestive) and diastolic (congestive) heart failure: Secondary | ICD-10-CM

## 2019-07-09 DIAGNOSIS — I10 Essential (primary) hypertension: Secondary | ICD-10-CM | POA: Diagnosis not present

## 2019-07-09 DIAGNOSIS — Z5181 Encounter for therapeutic drug level monitoring: Secondary | ICD-10-CM | POA: Diagnosis not present

## 2019-07-09 DIAGNOSIS — G300 Alzheimer's disease with early onset: Secondary | ICD-10-CM

## 2019-07-09 DIAGNOSIS — I513 Intracardiac thrombosis, not elsewhere classified: Secondary | ICD-10-CM

## 2019-07-09 DIAGNOSIS — E785 Hyperlipidemia, unspecified: Secondary | ICD-10-CM

## 2019-07-09 DIAGNOSIS — I2510 Atherosclerotic heart disease of native coronary artery without angina pectoris: Secondary | ICD-10-CM

## 2019-07-09 DIAGNOSIS — Z7901 Long term (current) use of anticoagulants: Secondary | ICD-10-CM

## 2019-07-09 DIAGNOSIS — F0280 Dementia in other diseases classified elsewhere without behavioral disturbance: Secondary | ICD-10-CM

## 2019-07-09 LAB — POCT INR: INR: 1.6 — AB (ref 2.0–3.0)

## 2019-07-09 NOTE — Progress Notes (Signed)
Cardiology Office Note   Date:  07/25/2019   ID:  Debbie Sims, DOB December 30, 1945, MRN [id number]  PCP:  Kathyrn Lass, MD  Cardiologist:   Skeet Latch, MD   Chief Complaint  Patient presents with  . Follow-up    Post eco.      History of Present Illness: Debbie Sims is a 74 y.o. female with chronic systolic and diastolic heart failure, CAD status post NSTEMI, hypertension,, hyperlipidemia, and advanced Alzheimer's dementia here for follow-up.  She was admitted 02/2019 with NSTEMI.  High-sensitivity troponin peaked at 4378.  Echo revealed LVEF 35 to 40% with apical thrombus.  She was medically managed due to her advanced dementia and inability to tolerate cardiac catheterization.  She was started on Plavix and Coumadin and goal directed medical therapy for heart failure.  She was discharged back to [facility] and was doing well when she followed up with [name], PA-C on 02/2019.  There was a discussion about starting Entresto.  However her son prefer that she remain on once daily dosing due to her dementia.  Since her last appointment [name] has been doing well.  She had a repeat echocardiogram 06/2019 that revealed LVEF 35 to 40%.  There was global hypokinesis worse in the anteroseptum.  Apical thrombus had resolved.  She continues to do well clinically.  She denies any chest pain or shortness of breath.  She has not experienced lower extremity edema, orthopnea, or PND.  She ambulates without difficulty.  Past Medical History:  Diagnosis Date  . Acute combined systolic and diastolic heart failure ([place]) 02/2019  . Alzheimer's disease ([name]) 07/21/2015   Moderate severity  . CAD in native artery 07/25/2019  . Chronic combined systolic and diastolic heart failure ([name]) 07/25/2019  . Complication of anesthesia    slow to wake up  . Depression   . Hypertension   . 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 Memory difficulty 07/20/2015  . Myocardial infarction ([name])  02/2019  . Vitamin B12 deficiency     Past Surgical History:  Procedure Laterality Date  . APPENDECTOMY    . ORIF ELBOW FRACTURE Right 11/27/2013   Procedure: OPEN REDUCTION INTERNAL FIXATION (ORIF) ELBOW/OLECRANON FRACTURE and Radial head replacement;  Surgeon: [name], MD;  Location: [place];  Service: Orthopedics;  Laterality: Right;     Current Outpatient Medications  Medication Sig Dispense Refill  . atorvastatin (LIPITOR) 80 MG tablet Take 1 tablet (80 mg total) by mouth daily. 90 tablet 3  . Cholecalciferol (VITAMIN D PO) Take 1 tablet by mouth daily.     . clopidogrel (PLAVIX) 75 MG tablet Take 1 tablet (75 mg total) by mouth daily. 90 tablet 3  . isosorbide mononitrate (IMDUR) 30 MG 24 hr tablet Take 1 tablet (30 mg total) by mouth daily. 90 tablet 3  . Memantine HCl-Donepezil HCl (NAMZARIC) 28-10 MG CP24 Take 1 capsule by mouth daily. Please keep your pending appt. with [name], NP on 06/18/17, arrival time of 12:30pm. (Patient taking differently: Take 1 capsule by mouth daily. ) 90 capsule 3  . metoprolol succinate (TOPROL XL) 25 MG 24 hr tablet Take 1 tablet (25 mg total) by mouth daily. 30 tablet 6  . nitroGLYCERIN (NITROSTAT) 0.4 MG SL tablet Place 1 tablet (0.4 mg total) under the tongue every 5 (five) minutes x 3 doses as needed for chest pain. 25 tablet 3  . quinapril (ACCUPRIL) 40 MG tablet Take 40 mg by mouth daily.    Marland Kitchen  sertraline (ZOLOFT) 100 MG tablet Take 100 mg by mouth daily.    . vitamin C (ASCORBIC ACID) 500 MG tablet Take 1 tablet (500 mg total) by mouth daily. 50 tablet 0  . warfarin (COUMADIN) 2.5 MG tablet Take as directed by coumadin clinic 90 tablet 0   No current facility-administered medications for this visit.    Allergies:   Amoxicillin, Trazodone, and Erythromycin    Social History:  The patient  reports that she has never smoked. She has never used smokeless tobacco. She reports current alcohol use of about 2.0 standard drinks of alcohol  per week. She reports that she does not use drugs.   Family History:  The patient's family history includes Arthritis in her mother; Dementia in her father, maternal aunt, and maternal uncle; Heart disease in her father; Kidney disease in her father; Prostate cancer in her father; Stroke in her mother.    ROS:  Please see the history of present illness.   Otherwise, review of systems are positive for none.   All other systems are reviewed and negative.    PHYSICAL EXAM: VS:  BP 110/68 (BP Location: Left Arm, Patient Position: Sitting, Cuff Size: Normal)   Pulse 66   Temp (!) 97.5 F (36.4 C)   Ht 5\' 2"  (1.575 m)   Wt 131 lb (59.4 kg)   BMI 23.96 kg/m  , BMI Body mass index is 23.96 kg/m. GENERAL:  Well appearing.  No acute distress HEENT:  Pupils equal round and reactive, fundi not visualized, oral mucosa unremarkable NECK:  No jugular venous distention, waveform within normal limits, carotid upstroke brisk and symmetric, no bruits LUNGS:  Clear to auscultation bilaterally HEART:  RRR.  PMI not displaced or sustained,S1 and S2 within normal limits, no S3, no S4, no clicks, no rubs, no murmurs ABD:  Flat, positive bowel sounds normal in frequency in pitch, no bruits, no rebound, no guarding, no midline pulsatile mass, no hepatomegaly, no splenomegaly EXT:  2 plus pulses throughout, no edema, no cyanosis no clubbing SKIN:  No rashes no nodules NEURO:  Cranial nerves II through XII grossly intact, motor grossly intact throughout PSYCH: Dementia.  Oriented to self only.    EKG:  EKG is not ordered today. The ekg ordered today demonstrates   Echo 03/06/19: IMPRESSIONS   1. Left ventricular ejection fraction, by visual estimation, is 35-40%. The left ventricle has moderately reduced LVF. Normal left ventricular size. There is moderately increased left ventricular hypertrophy of the basal septum. 2. Definity contrast agent was given IV to delineate the left ventricular endocardial  borders. 3. There is akinesis of the apical, apical anterior, basal and mid antero and inferoseptum, apical septal, apical inferior walls with LV apical mural thrombus. 4. Global right ventricle has normal systolic function.The right ventricular size is normal. No increase in right ventricular wall thickness. 5. Left atrial size was normal. 6. Right atrial size was normal. 7. The mitral valve is normal in structure. No evidence of mitral valve regurgitation. No evidence of mitral stenosis. 8. The tricuspid valve is normal in structure. Tricuspid valve regurgitation was not visualized by color flow Doppler. 9. The aortic valve is normal in structure. Aortic valve regurgitation was not visualized by color flow Doppler. Structurally normal aortic valve, with no evidence of sclerosis or stenosis. 10. The pulmonic valve was normal in structure. Pulmonic valve regurgitation is not visualized by color flow Doppler. 11. The inferior vena cava is normal in size with greater than 50% respiratory  variability, suggesting right atrial pressure of 3 mmHg. 12. Left ventricular diastolic Doppler parameters are consistent with impaired relaxation pattern of LV diastolic filling. 13. TR signal is inadequate for assessing pulmonary artery systolic pressure.  Echo 07/04/19:  IMPRESSIONS   1. Left ventricular ejection fraction, by visual estimation, is 35 to  40%. The left ventricle has normal function. Left ventricular septal wall  thickness was mildly increased. Mildly increased left ventricular  posterior wall thickness. There is mildly  increased left ventricular hypertrophy.  2. Elevated left ventricular end-diastolic pressure.  3. Left ventricular diastolic parameters are consistent with Grade I  diastolic dysfunction (impaired relaxation).  4. The left ventricle demonstrates global hypokinesis.  5. GLobal hypokinesis worse in the anteroseptum.  6. Global right ventricle has normal systolic  function.The right  ventricular size is normal. No increase in right ventricular wall  thickness.  7. Left atrial size was severely dilated.  8. Right atrial size was normal.  9. The mitral valve is normal in structure. Mild mitral valve  regurgitation. No evidence of mitral stenosis.  10. The tricuspid valve is normal in structure.  11. The tricuspid valve is normal in structure. Tricuspid valve  regurgitation is mild.  12. The aortic valve is tricuspid. Aortic valve regurgitation is mild. No  evidence of aortic valve sclerosis or stenosis.  13. The pulmonic valve was normal in structure. Pulmonic valve  regurgitation is not visualized.  14. Normal pulmonary artery systolic pressure.  15. The inferior vena cava is normal in size with greater than 50%  respiratory variability, suggesting right atrial pressure of 3 mmHg.   Recent Labs: 03/08/2019: ALT 14; Hemoglobin 13.9; Platelets 192 03/19/2019: BUN 16; Creatinine, Ser 0.94; Potassium 4.7; Sodium 144    Lipid Panel    Component Value Date/Time   CHOL 228 (H) 03/07/2019 0249   TRIG 122 03/07/2019 0249   HDL 66 03/07/2019 0249   CHOLHDL 3.5 03/07/2019 0249   VLDL 24 03/07/2019 0249   LDLCALC 138 (H) 03/07/2019 0249      Wt Readings from Last 3 Encounters:  07/09/19 131 lb (59.4 kg)  06/19/19 132 lb 6.4 oz (60.1 kg)  03/19/19 127 lb 12.8 oz (58 kg)      ASSESSMENT AND PLAN:  # NSTEMI:  # Hyperlipidemia:  Medically managed due to dementia. She is doing well and has no angina.  Continue Plavix, atorvastatin, Imdur, and metoprolol.  Check lipids/CMP.  # Acute systolic/diastolic heart failure: LVEF 35-40%.  She is euvolemic.  Continue metoprolol, quinapril, and Imdur.  Sherryll Burger was offered, but her son prefers giving her her meds once daily due to her dementia.  # Apical thrombus:  Resolved on repeat echocardiogram.  However given her persistent reduction in systolic function and wall motion abnormality, we will  continue warfarin.  # Hypertension:  Blood pressure well-controlled on current regimen.  # Dementia: Continue Memantine, donepezil, and Sertraline.    Current medicines are reviewed at length with the patient today.  The patient does not have concerns regarding medicines.  The following changes have been made:  no change  Labs/ tests ordered today include:   Orders Placed This Encounter  Procedures  . Comprehensive metabolic panel  . Lipid panel     Disposition:   FU with Zacory Fiola C. Duke Salvia, MD, [facility] in 6 months    Signed, Fable Huisman C. Duke Salvia, MD, [facility]  07/25/2019 2:34 PM    [facility]

## 2019-07-09 NOTE — Patient Instructions (Signed)
Take 1 table of warfarin today, then continue taking 1/2 tablet daily except 1 tablet each Sunday and Thursday.  Repeat INR in 2 week  *Call to report results to [employer] at  [phone]*

## 2019-07-09 NOTE — Patient Instructions (Signed)
Medication Instructions:  Your physician recommends that you continue on your current medications as directed. Please refer to the Current Medication list given to you today.  *If you need a refill on your cardiac medications before your next appointment, please call your pharmacy*  Lab Work: FASTING LP/CMET SOON   If you have labs (blood work) drawn today and your tests are completely normal, you will receive your results only by: Marland Kitchen MyChart Message (if you have MyChart) OR . A paper copy in the mail If you have any lab test that is abnormal or we need to change your treatment, we will call you to review the results.  Testing/Procedures: NONE   Follow-Up: At [facility], you and your health needs are our priority.  As part of our continuing mission to provide you with exceptional heart care, we have created designated Provider Care Teams.  These Care Teams include your primary Cardiologist (physician) and Advanced Practice Providers (APPs -  Physician Assistants and Nurse Practitioners) who all work together to provide you with the care you need, when you need it.  Your next appointment:   6 month(s)  The format for your next appointment:   Either In Person or Virtual  Provider:   You may see Chilton Si, MD or one of the following Advanced Practice Providers on your designated Care Team:    Corine Shelter, PA-C  [place], [place]  Edd Fabian, [place]

## 2019-07-18 ENCOUNTER — Ambulatory Visit: Payer: Self-pay | Admitting: Pharmacist Clinician (PhC)/ Clinical Pharmacy Specialist

## 2019-07-18 DIAGNOSIS — I513 Intracardiac thrombosis, not elsewhere classified: Secondary | ICD-10-CM

## 2019-07-18 DIAGNOSIS — Z7901 Long term (current) use of anticoagulants: Secondary | ICD-10-CM

## 2019-07-18 LAB — POCT INR: INR: 2.1 (ref 2.0–3.0)

## 2019-07-25 ENCOUNTER — Encounter: Payer: Self-pay | Admitting: Cardiovascular Disease

## 2019-07-25 DIAGNOSIS — I2510 Atherosclerotic heart disease of native coronary artery without angina pectoris: Secondary | ICD-10-CM

## 2019-07-25 DIAGNOSIS — I5042 Chronic combined systolic (congestive) and diastolic (congestive) heart failure: Secondary | ICD-10-CM

## 2019-07-25 HISTORY — DX: Atherosclerotic heart disease of native coronary artery without angina pectoris: I25.10

## 2019-07-25 HISTORY — DX: Chronic combined systolic (congestive) and diastolic (congestive) heart failure: I50.42

## 2019-07-28 ENCOUNTER — Telehealth: Payer: Self-pay

## 2019-07-28 NOTE — Telephone Encounter (Signed)
Pt's son called to let us know that Debbie Sims is authorized to talk to [place] regarding the pt and her health

## 2019-07-28 NOTE — Telephone Encounter (Signed)
This encounter was created in error - please disregard.

## 2019-08-20 ENCOUNTER — Telehealth: Payer: Self-pay | Admitting: Pharmacist Clinician (PhC)/ Clinical Pharmacy Specialist

## 2019-08-20 NOTE — Telephone Encounter (Signed)
LMOM for Telford Nab RN who was checking INR readings for patient.  She returned call, stated that patient care had been turned over to [facility] with RN Jeanice Lim taking over.    Called [facility] and left message with receptionist to have [place] return call.

## 2019-08-20 NOTE — Telephone Encounter (Signed)
jered the pharmd that handles mrs amman's medication management stated that they ran out of the test strips and that has turned into a 4 week process since they reported it to the [employer]. I advised the the pharmd manager that we need to see the pt in the office asap since it has been quite a while since the last check unless the strips come in. The pharmd manager of medicine stated that he would contact the son and call us back to schedule the in office visit asap.

## 2019-08-21 ENCOUNTER — Ambulatory Visit (INDEPENDENT_AMBULATORY_CARE_PROVIDER_SITE_OTHER): Payer: Medicare PPO | Admitting: Cardiovascular Disease

## 2019-08-21 DIAGNOSIS — I513 Intracardiac thrombosis, not elsewhere classified: Secondary | ICD-10-CM | POA: Diagnosis not present

## 2019-08-21 DIAGNOSIS — Z7901 Long term (current) use of anticoagulants: Secondary | ICD-10-CM | POA: Diagnosis not present

## 2019-08-21 LAB — POCT INR: INR: 1.5 — AB (ref 2.0–3.0)

## 2019-08-22 NOTE — Telephone Encounter (Signed)
See anticoag note

## 2019-08-27 ENCOUNTER — Ambulatory Visit (INDEPENDENT_AMBULATORY_CARE_PROVIDER_SITE_OTHER): Payer: Medicare PPO | Admitting: Internal Medicine

## 2019-08-27 DIAGNOSIS — Z7901 Long term (current) use of anticoagulants: Secondary | ICD-10-CM

## 2019-08-27 DIAGNOSIS — I513 Intracardiac thrombosis, not elsewhere classified: Secondary | ICD-10-CM

## 2019-08-27 LAB — POCT INR: INR: 3.7 — AB (ref 2.0–3.0)

## 2019-08-28 ENCOUNTER — Other Ambulatory Visit: Payer: Self-pay | Admitting: Cardiovascular Disease

## 2019-08-28 ENCOUNTER — Other Ambulatory Visit: Payer: Self-pay

## 2019-08-28 ENCOUNTER — Encounter: Payer: Self-pay | Admitting: Cardiovascular Disease

## 2019-08-28 MED ORDER — WARFARIN SODIUM 2.5 MG PO TABS: ORAL_TABLET | ORAL | 0 refills | Status: DC

## 2019-08-28 NOTE — Telephone Encounter (Signed)
This encounter was created in error - please disregard.

## 2019-09-02 ENCOUNTER — Telehealth: Payer: Self-pay | Admitting: Cardiovascular Disease

## 2019-09-02 NOTE — Telephone Encounter (Signed)
New Message    [name] is calling from [facility] and says they sent an order on 03/26/19 and again 08/26/19. The order is a home health order    Please advise

## 2019-09-02 NOTE — Telephone Encounter (Signed)
Spoke with [employer]. Fax is in the hand of primary nurse for MD review. Fax should be sent back today or tomorrow. [name] verbalized understanding.

## 2019-09-05 ENCOUNTER — Ambulatory Visit (INDEPENDENT_AMBULATORY_CARE_PROVIDER_SITE_OTHER): Payer: Medicare PPO | Admitting: Pharmacist

## 2019-09-05 DIAGNOSIS — I513 Intracardiac thrombosis, not elsewhere classified: Secondary | ICD-10-CM | POA: Diagnosis not present

## 2019-09-05 DIAGNOSIS — Z7901 Long term (current) use of anticoagulants: Secondary | ICD-10-CM

## 2019-09-05 LAB — POCT INR: INR: 1.6 — AB (ref 2.0–3.0)

## 2019-09-05 MED ORDER — WARFARIN SODIUM 2.5 MG PO TABS: ORAL_TABLET | ORAL | 0 refills | Status: DC

## 2019-09-08 ENCOUNTER — Other Ambulatory Visit: Payer: Self-pay | Admitting: Pharmacist

## 2019-09-08 MED ORDER — WARFARIN SODIUM 2.5 MG PO TABS: ORAL_TABLET | ORAL | 0 refills | Status: DC

## 2019-09-12 ENCOUNTER — Encounter (INDEPENDENT_AMBULATORY_CARE_PROVIDER_SITE_OTHER): Payer: Medicare PPO | Admitting: Pharmacist Clinician (PhC)/ Clinical Pharmacy Specialist

## 2019-09-12 DIAGNOSIS — Z7901 Long term (current) use of anticoagulants: Secondary | ICD-10-CM

## 2019-09-12 DIAGNOSIS — I513 Intracardiac thrombosis, not elsewhere classified: Secondary | ICD-10-CM

## 2019-09-12 NOTE — Progress Notes (Signed)
This encounter was created in error - please disregard.

## 2019-09-15 ENCOUNTER — Telehealth: Payer: Self-pay

## 2019-09-15 NOTE — Telephone Encounter (Signed)
Incoming call from pharmd jerred regarding her inr and he stated that ms. Debbie Sims has been throwing stuff away including her test strips. Mr jerred has ordered more and just wanted to keep [place] posted

## 2019-09-17 ENCOUNTER — Ambulatory Visit (INDEPENDENT_AMBULATORY_CARE_PROVIDER_SITE_OTHER): Payer: Medicare PPO | Admitting: Cardiology

## 2019-09-17 DIAGNOSIS — Z7901 Long term (current) use of anticoagulants: Secondary | ICD-10-CM

## 2019-09-17 DIAGNOSIS — I513 Intracardiac thrombosis, not elsewhere classified: Secondary | ICD-10-CM

## 2019-09-17 LAB — POCT INR: INR: 1.3 — AB (ref 2.0–3.0)

## 2019-09-30 LAB — POCT INR: INR: 2.3 (ref 2.0–3.0)

## 2019-10-01 ENCOUNTER — Ambulatory Visit (INDEPENDENT_AMBULATORY_CARE_PROVIDER_SITE_OTHER): Payer: Medicare PPO | Admitting: Pharmacist Clinician (PhC)/ Clinical Pharmacy Specialist

## 2019-10-01 DIAGNOSIS — I513 Intracardiac thrombosis, not elsewhere classified: Secondary | ICD-10-CM

## 2019-10-01 DIAGNOSIS — Z7901 Long term (current) use of anticoagulants: Secondary | ICD-10-CM | POA: Diagnosis not present

## 2019-10-20 LAB — POCT INR: INR: 1.9 — AB (ref 2.0–3.0)

## 2019-10-21 ENCOUNTER — Ambulatory Visit (INDEPENDENT_AMBULATORY_CARE_PROVIDER_SITE_OTHER): Payer: Medicare PPO | Admitting: Pharmacist

## 2019-10-21 DIAGNOSIS — Z7901 Long term (current) use of anticoagulants: Secondary | ICD-10-CM

## 2019-10-21 DIAGNOSIS — I513 Intracardiac thrombosis, not elsewhere classified: Secondary | ICD-10-CM

## 2019-11-06 LAB — POCT INR: INR: 1.9 — AB (ref 2.0–3.0)

## 2019-11-07 ENCOUNTER — Ambulatory Visit (INDEPENDENT_AMBULATORY_CARE_PROVIDER_SITE_OTHER): Payer: Medicare PPO | Admitting: Pharmacist

## 2019-11-07 DIAGNOSIS — I513 Intracardiac thrombosis, not elsewhere classified: Secondary | ICD-10-CM | POA: Diagnosis not present

## 2019-11-07 DIAGNOSIS — Z7901 Long term (current) use of anticoagulants: Secondary | ICD-10-CM

## 2019-11-13 LAB — POCT INR: INR: 1.6 — AB (ref 2.0–3.0)

## 2019-11-21 ENCOUNTER — Ambulatory Visit (INDEPENDENT_AMBULATORY_CARE_PROVIDER_SITE_OTHER): Payer: Medicare PPO | Admitting: Pharmacist Clinician (PhC)/ Clinical Pharmacy Specialist

## 2019-11-21 DIAGNOSIS — I513 Intracardiac thrombosis, not elsewhere classified: Secondary | ICD-10-CM

## 2019-11-21 DIAGNOSIS — Z7901 Long term (current) use of anticoagulants: Secondary | ICD-10-CM | POA: Diagnosis not present

## 2019-11-21 LAB — POCT INR: INR: 1.6 — AB (ref 2.0–3.0)

## 2019-12-04 ENCOUNTER — Telehealth: Payer: Self-pay | Admitting: Pharmacist

## 2019-12-04 NOTE — Telephone Encounter (Signed)
Jarod with home health called to report that patient has been spitting out her warfarin tablets and when he came today she had thrown out all of her anticoag strips.  He ordered a new shipment and will attempt to drawn INR next week.

## 2019-12-10 ENCOUNTER — Ambulatory Visit (INDEPENDENT_AMBULATORY_CARE_PROVIDER_SITE_OTHER): Payer: Medicare PPO | Admitting: Internal Medicine

## 2019-12-10 DIAGNOSIS — I513 Intracardiac thrombosis, not elsewhere classified: Secondary | ICD-10-CM | POA: Diagnosis not present

## 2019-12-10 DIAGNOSIS — Z7901 Long term (current) use of anticoagulants: Secondary | ICD-10-CM

## 2019-12-10 LAB — POCT INR: INR: 1.2 — AB (ref 2.0–3.0)

## 2019-12-23 ENCOUNTER — Other Ambulatory Visit: Payer: Self-pay | Admitting: Cardiovascular Disease

## 2019-12-24 ENCOUNTER — Ambulatory Visit (INDEPENDENT_AMBULATORY_CARE_PROVIDER_SITE_OTHER): Payer: Medicare PPO | Admitting: Pharmacist

## 2019-12-24 DIAGNOSIS — I513 Intracardiac thrombosis, not elsewhere classified: Secondary | ICD-10-CM | POA: Diagnosis not present

## 2019-12-24 DIAGNOSIS — Z7901 Long term (current) use of anticoagulants: Secondary | ICD-10-CM

## 2019-12-24 LAB — POCT INR: INR: 1.9 — AB (ref 2.0–3.0)

## 2020-01-07 ENCOUNTER — Ambulatory Visit (INDEPENDENT_AMBULATORY_CARE_PROVIDER_SITE_OTHER): Payer: Medicare PPO | Admitting: Cardiology

## 2020-01-07 DIAGNOSIS — I513 Intracardiac thrombosis, not elsewhere classified: Secondary | ICD-10-CM

## 2020-01-07 DIAGNOSIS — Z7901 Long term (current) use of anticoagulants: Secondary | ICD-10-CM

## 2020-01-07 LAB — POCT INR: INR: 1.7 — AB (ref 2.0–3.0)

## 2020-01-21 ENCOUNTER — Ambulatory Visit (INDEPENDENT_AMBULATORY_CARE_PROVIDER_SITE_OTHER): Payer: Medicare PPO | Admitting: Internal Medicine

## 2020-01-21 DIAGNOSIS — I513 Intracardiac thrombosis, not elsewhere classified: Secondary | ICD-10-CM | POA: Diagnosis not present

## 2020-01-21 DIAGNOSIS — Z7901 Long term (current) use of anticoagulants: Secondary | ICD-10-CM | POA: Diagnosis not present

## 2020-01-21 LAB — POCT INR: INR: 1.9 — AB (ref 2.0–3.0)

## 2020-02-05 ENCOUNTER — Ambulatory Visit (INDEPENDENT_AMBULATORY_CARE_PROVIDER_SITE_OTHER): Payer: Medicare PPO | Admitting: Cardiovascular Disease

## 2020-02-05 DIAGNOSIS — I513 Intracardiac thrombosis, not elsewhere classified: Secondary | ICD-10-CM

## 2020-02-05 DIAGNOSIS — Z7901 Long term (current) use of anticoagulants: Secondary | ICD-10-CM

## 2020-02-05 LAB — POCT INR: INR: 1.8 — AB (ref 2.0–3.0)

## 2020-02-24 ENCOUNTER — Telehealth: Payer: Self-pay

## 2020-02-24 NOTE — Telephone Encounter (Signed)
Called & spoke w/pt's son regarding warfarin and he stated that they transitioned her to eliquis so I closed the encounter

## 2020-03-22 ENCOUNTER — Telehealth: Payer: Self-pay | Admitting: Neurology

## 2020-03-22 DIAGNOSIS — F0280 Dementia in other diseases classified elsewhere without behavioral disturbance: Secondary | ICD-10-CM

## 2020-03-22 DIAGNOSIS — G309 Alzheimer's disease, unspecified: Secondary | ICD-10-CM

## 2020-03-22 NOTE — Telephone Encounter (Signed)
Pt's son called stating that the pt was moved ti [place] to a facility and he is wanting to know if there is any way he can speak to the RN about getting a referral to someone near them. Please advise.

## 2020-03-22 NOTE — Telephone Encounter (Signed)
I called and talk with the son.  Patient has been Alzheimer's disease, she has behaviorally decompensated with the move to the [place] area.  They want a referral to a neurologist in this area, I will refer to [facility] neurology.

## 2020-03-22 NOTE — Addendum Note (Signed)
Addended by: York Spaniel on: 03/22/2020 04:48 PM   Modules accepted: Orders

## 2020-03-23 NOTE — Telephone Encounter (Signed)
Pt's son called wanting to let the provider know that he has spoken to his mother and she was completely incoherent during the conversation.

## 2020-03-23 NOTE — Telephone Encounter (Signed)
Clearly, his mother has decompensated with the move, I have made a referral to [facility] neurology, they may need to do a bit of a medical work-up as well to rule out other causes of encephalopathy.

## 2020-09-20 ENCOUNTER — Telehealth: Payer: Self-pay | Admitting: Cardiovascular Disease

## 2020-09-20 NOTE — Telephone Encounter (Signed)
Left message on son's voicemail to call and update address
